# Patient Record
Sex: Female | Born: 1975
Health system: Southern US, Community
[De-identification: ages and names within clinical notes are randomized; demographics above are authoritative.]

## PROBLEM LIST (undated history)

## (undated) DIAGNOSIS — S0990XA Unspecified injury of head, initial encounter: Secondary | ICD-10-CM

## (undated) DIAGNOSIS — G43909 Migraine, unspecified, not intractable, without status migrainosus: Secondary | ICD-10-CM

## (undated) DIAGNOSIS — F419 Anxiety disorder, unspecified: Secondary | ICD-10-CM

## (undated) DIAGNOSIS — N3281 Overactive bladder: Secondary | ICD-10-CM

## (undated) HISTORY — PX: TUBAL LIGATION: SHX77

## (undated) HISTORY — PX: INSERTION OF MESH: SHX5868

## (undated) HISTORY — DX: Migraine, unspecified, not intractable, without status migrainosus: G43.909

## (undated) HISTORY — DX: Anxiety disorder, unspecified: F41.9

---

## 2013-10-10 ENCOUNTER — Encounter (HOSPITAL_BASED_OUTPATIENT_CLINIC_OR_DEPARTMENT_OTHER): Payer: Self-pay | Admitting: Emergency Medicine

## 2013-10-10 ENCOUNTER — Emergency Department (HOSPITAL_BASED_OUTPATIENT_CLINIC_OR_DEPARTMENT_OTHER)
Admission: EM | Admit: 2013-10-10 | Discharge: 2013-10-10 | Disposition: A | Discharge status: Home / Self Care | Payer: Medicaid Other | Attending: Emergency Medicine | Admitting: Emergency Medicine

## 2013-10-10 DIAGNOSIS — J329 Chronic sinusitis, unspecified: Secondary | ICD-10-CM | POA: Insufficient documentation

## 2013-10-10 DIAGNOSIS — IMO0001 Reserved for inherently not codable concepts without codable children: Secondary | ICD-10-CM | POA: Insufficient documentation

## 2013-10-10 LAB — RAPID STREP SCREEN (MED CTR MEBANE ONLY): Streptococcus, Group A Screen (Direct): NEGATIVE

## 2013-10-10 MED ORDER — AMOXICILLIN 500 MG PO CAPS
1000.0000 mg | ORAL_CAPSULE | Freq: Once | ORAL | Status: AC
  Administered 2013-10-10: 1000 mg via ORAL
  Filled 2013-10-10: qty 2

## 2013-10-10 MED ORDER — ACETAMINOPHEN 325 MG PO TABS
650.0000 mg | ORAL_TABLET | Freq: Once | ORAL | Status: AC
  Administered 2013-10-10: 650 mg via ORAL
  Filled 2013-10-10: qty 2

## 2013-10-10 MED ORDER — AMOXICILLIN 500 MG PO CAPS: 1000.0000 mg | ORAL_CAPSULE | Freq: Two times a day (BID) | ORAL | Status: DC

## 2013-10-10 NOTE — ED Notes (Signed)
Sore throat and chills since yesterday.  

## 2013-10-10 NOTE — ED Provider Notes (Signed)
CSN: [id number]     Arrival date & time 10/10/13  1709 History   First MD Initiated Contact with Patient 10/10/13 1931     Chief Complaint  Patient presents with  . Sore Throat     (Consider location/radiation/quality/duration/timing/severity/associated sxs/prior Treatment) Patient is a 38 y.o. female presenting with pharyngitis. The history is provided by the patient. No language interpreter was used.  Sore Throat This is a new problem. The current episode started yesterday. The problem occurs constantly. Associated symptoms include congestion, myalgias and a sore throat. Pertinent negatives include no abdominal pain, chills, coughing, fever, nausea, rash or vomiting. Associated symptoms comments: She reports sinus pain and congestion with sinus pressure for the past 3 weeks. Yesterday she started having a sore throat and chills. No N, V. No cough. .    History reviewed. No pertinent past medical history. History reviewed. No pertinent past surgical history. No family history on file. History  Substance Use Topics  . Smoking status: Never Smoker   . Smokeless tobacco: Not on file  . Alcohol Use: Yes   OB History   Grav Para Term Preterm Abortions TAB SAB Ect Mult Living                 Review of Systems  Constitutional: Negative for fever and chills.  HENT: Positive for congestion, sinus pressure and sore throat. Negative for facial swelling, trouble swallowing and voice change.   Respiratory: Negative.  Negative for cough and shortness of breath.   Cardiovascular: Negative.   Gastrointestinal: Negative.  Negative for nausea, vomiting and abdominal pain.  Musculoskeletal: Positive for myalgias.  Skin: Negative.  Negative for rash.  Neurological: Negative.       Allergies  Review of patient's allergies indicates no known allergies.  Home Medications  No current outpatient prescriptions on file. BP 138/50  Pulse 109  Temp(Src) 100.4 F (38 C) (Oral)  Resp 22  Ht 5'  5" (1.651 m)  Wt 194 lb (87.998 kg)  BMI 32.28 kg/m2  SpO2 100%  LMP 10/07/2013 Physical Exam  Constitutional: She is oriented to person, place, and time. She appears well-developed and well-nourished.  HENT:  Head: Normocephalic.  Right Ear: External ear normal.  Left Ear: External ear normal.  Nose: Mucosal edema present. Right sinus exhibits frontal sinus tenderness. Left sinus exhibits frontal sinus tenderness.  Mouth/Throat: Oropharynx is clear and moist.  Neck: Normal range of motion. Neck supple.  Cardiovascular: Normal rate and normal heart sounds.   No murmur heard. Pulmonary/Chest: Effort normal and breath sounds normal. She has no wheezes. She has no rales.  Abdominal: Soft. Bowel sounds are normal. She exhibits no distension. There is no tenderness.  Musculoskeletal: Normal range of motion.  Lymphadenopathy:    She has no cervical adenopathy.  Neurological: She is oriented to person, place, and time.  Skin: Skin is warm and dry. No pallor.    ED Course  Procedures (including critical care time) Labs Review Labs Reviewed  RAPID STREP SCREEN  CULTURE, GROUP A STREP   Imaging Review No results found.   EKG Interpretation None      MDM   Final diagnoses:  None    1. Sinusitis  Uncomplicated sinus infection.     Arnoldo HookerShari A Manfred Laspina, PA-C 10/10/13 2029

## 2013-10-10 NOTE — Discharge Instructions (Signed)
RECOMMEND SALINE NASAL SPRAYS FOR FURTHER RELIEF OF SYMPTOMS. CONTINUE TYLENOL AND/OR IBUPROFEN FOR ACHES, FEVER.    Sinusitis Sinusitis is redness, soreness, and swelling (inflammation) of the paranasal sinuses. Paranasal sinuses are air pockets within the bones of your face (beneath the eyes, the middle of the forehead, or above the eyes). In healthy paranasal sinuses, mucus is able to drain out, and air is able to circulate through them by way of your nose. However, when your paranasal sinuses are inflamed, mucus and air can become trapped. This can allow bacteria and other germs to grow and cause infection. Sinusitis can develop quickly and last only a short time (acute) or continue over a long period (chronic). Sinusitis that lasts for more than 12 weeks is considered chronic.  CAUSES  Causes of sinusitis include:  Allergies.  Structural abnormalities, such as displacement of the cartilage that separates your nostrils (deviated septum), which can decrease the air flow through your nose and sinuses and affect sinus drainage.  Functional abnormalities, such as when the small hairs (cilia) that line your sinuses and help remove mucus do not work properly or are not present. SYMPTOMS  Symptoms of acute and chronic sinusitis are the same. The primary symptoms are pain and pressure around the affected sinuses. Other symptoms include:  Upper toothache.  Earache.  Headache.  Bad breath.  Decreased sense of smell and taste.  A cough, which worsens when you are lying flat.  Fatigue.  Fever.  Thick drainage from your nose, which often is green and may contain pus (purulent).  Swelling and warmth over the affected sinuses. DIAGNOSIS  Your caregiver will perform a physical exam. During the exam, your caregiver may:  Look in your nose for signs of abnormal growths in your nostrils (nasal polyps).  Tap over the affected sinus to check for signs of infection.  View the inside of your  sinuses (endoscopy) with a special imaging device with a light attached (endoscope), which is inserted into your sinuses. If your caregiver suspects that you have chronic sinusitis, one or more of the following tests may be recommended:  Allergy tests.  Nasal culture A sample of mucus is taken from your nose and sent to a lab and screened for bacteria.  Nasal cytology A sample of mucus is taken from your nose and examined by your caregiver to determine if your sinusitis is related to an allergy. TREATMENT  Most cases of acute sinusitis are related to a viral infection and will resolve on their own within 10 days. Sometimes medicines are prescribed to help relieve symptoms (pain medicine, decongestants, nasal steroid sprays, or saline sprays).  However, for sinusitis related to a bacterial infection, your caregiver will prescribe antibiotic medicines. These are medicines that will help kill the bacteria causing the infection.  Rarely, sinusitis is caused by a fungal infection. In theses cases, your caregiver will prescribe antifungal medicine. For some cases of chronic sinusitis, surgery is needed. Generally, these are cases in which sinusitis recurs more than 3 times per year, despite other treatments. HOME CARE INSTRUCTIONS   Drink plenty of water. Water helps thin the mucus so your sinuses can drain more easily.  Use a humidifier.  Inhale steam 3 to 4 times a day (for example, sit in the bathroom with the shower running).  Apply a warm, moist washcloth to your face 3 to 4 times a day, or as directed by your caregiver.  Use saline nasal sprays to help moisten and clean your sinuses.  Take over-the-counter or prescription medicines for pain, discomfort, or fever only as directed by your caregiver. SEEK IMMEDIATE MEDICAL CARE IF:  You have increasing pain or severe headaches.  You have nausea, vomiting, or drowsiness.  You have swelling around your face.  You have vision  problems.  You have a stiff neck.  You have difficulty breathing. MAKE SURE YOU:   Understand these instructions.  Will watch your condition.  Will get help right away if you are not doing well or get worse. Document Released: 07/14/2005 Document Revised: 10/06/2011 Document Reviewed: 07/29/2011 [facility] Patient Information 2014 [place], [place].  Sinus Headache A sinus headache happens when your sinuses become clogged or puffy (swollen). Sinus headaches can be mild or severe. HOME CARE  Take your medicines (antibiotics) as told. Finish them even if you start to feel better.  Only take medicine as told by your doctor.  Use a nose spray if you feel stuffed up (congested). GET HELP RIGHT AWAY IF:  You have a fever.  You have trouble seeing.  You suddenly have pain in your face or head.  You start to twitch or shake (seizure).  You are confused.  You get headaches more than once a week.  Light or sound bothers you.  You feel sick to your stomach (nauseous) or throw up (vomit).  Your headaches do not get better with treatment. MAKE SURE YOU:  Understand these instructions.  Will watch your condition.  Will get help right away if you are not doing well or get worse. Document Released: 11/13/2010 Document Revised: 10/06/2011 Document Reviewed: 11/13/2010 [facility] Patient Information 2014 [place], [place].

## 2013-10-12 NOTE — ED Provider Notes (Signed)
Medical screening examination/treatment/procedure(s) were performed by non-physician practitioner and as supervising physician I was immediately available for consultation/collaboration.  Megan E Docherty, MD 10/12/13 1204 

## 2013-10-13 LAB — CULTURE, GROUP A STREP

## 2017-02-20 ENCOUNTER — Encounter (HOSPITAL_BASED_OUTPATIENT_CLINIC_OR_DEPARTMENT_OTHER): Payer: Self-pay

## 2017-02-20 ENCOUNTER — Emergency Department (HOSPITAL_BASED_OUTPATIENT_CLINIC_OR_DEPARTMENT_OTHER)
Admission: EM | Admit: 2017-02-20 | Discharge: 2017-02-20 | Disposition: A | Discharge status: Home / Self Care | Payer: Medicaid Other | Attending: Emergency Medicine | Admitting: Emergency Medicine

## 2017-02-20 DIAGNOSIS — R300 Dysuria: Secondary | ICD-10-CM | POA: Diagnosis present

## 2017-02-20 DIAGNOSIS — N390 Urinary tract infection, site not specified: Secondary | ICD-10-CM | POA: Diagnosis not present

## 2017-02-20 HISTORY — DX: Overactive bladder: N32.81

## 2017-02-20 LAB — URINALYSIS, MICROSCOPIC (REFLEX)

## 2017-02-20 LAB — URINALYSIS, ROUTINE W REFLEX MICROSCOPIC
Bilirubin Urine: NEGATIVE
Glucose, UA: NEGATIVE mg/dL
Ketones, ur: NEGATIVE mg/dL
Nitrite: NEGATIVE
PROTEIN: NEGATIVE mg/dL
Specific Gravity, Urine: 1.024 (ref 1.005–1.030)
pH: 7 (ref 5.0–8.0)

## 2017-02-20 LAB — PREGNANCY, URINE: PREG TEST UR: NEGATIVE

## 2017-02-20 MED ORDER — FLUCONAZOLE 150 MG PO TABS: 150.0000 mg | ORAL_TABLET | Freq: Once | ORAL | 0 refills | Status: AC

## 2017-02-20 MED ORDER — CEPHALEXIN 500 MG PO CAPS: 500.0000 mg | ORAL_CAPSULE | Freq: Four times a day (QID) | ORAL | 0 refills | Status: DC

## 2017-02-20 MED ORDER — PHENAZOPYRIDINE HCL 100 MG PO TABS
200.0000 mg | ORAL_TABLET | Freq: Once | ORAL | Status: AC
  Administered 2017-02-20: 200 mg via ORAL
  Filled 2017-02-20: qty 2

## 2017-02-20 MED ORDER — CEPHALEXIN 250 MG PO CAPS
500.0000 mg | ORAL_CAPSULE | Freq: Once | ORAL | Status: AC
  Administered 2017-02-20: 500 mg via ORAL
  Filled 2017-02-20: qty 2

## 2017-02-20 NOTE — ED Triage Notes (Signed)
Pt c/o urinary frequency for the last two days with urgency and feeling like she cannot empty her bladder, has midline back pain, vaginal discharge and subjective fever, has not taken anything except tylenol two days ago

## 2017-02-20 NOTE — Discharge Instructions (Signed)
Take the medications as prescribed for the urinary tract infection. Follow-up with your doctor if not better next week. Return for fever or worsening symptoms

## 2017-02-20 NOTE — ED Provider Notes (Signed)
[facility]   CSN: [id number] Arrival date & time: 02/20/17  2129     History   Chief Complaint Chief Complaint  Patient presents with  . Dysuria    HPI Jill [name] is a 41 y.o. female.  HPI Patient presents to the emergency room with complaints of urinary frequency and discomfort.  Patient states she has a history of urinary tract infections and thinks she may have another one. She does not feel like she can empty her bladder properly. She's had some lower back pain but no trouble with any fevers. No vomiting or diarrhea. Patient denies any vaginal odor. She denies any discharge but feels more of a discomfort in that area. Patient states she has had problems with bacterial vaginosis and yeast infections when she takes antibiotics. Past Medical History:  Diagnosis Date  . Overactive bladder     There are no active problems to display for this patient.   Past Surgical History:  Procedure Laterality Date  . TUBAL LIGATION      OB History    No data available       Home Medications    Prior to Admission medications   Medication Sig Start Date End Date Taking? Authorizing Provider  amoxicillin (AMOXIL) 500 MG capsule Take 2 capsules (1,000 mg total) by mouth 2 (two) times daily. 10/10/13   Elpidio AnisUpstill, Shari, PA-C  cephALEXin (KEFLEX) 500 MG capsule Take 1 capsule (500 mg total) by mouth 4 (four) times daily. 02/20/17   Linwood DibblesKnapp, Estephany Perot, MD  fluconazole (DIFLUCAN) 150 MG tablet Take 1 tablet (150 mg total) by mouth once. 02/20/17 02/20/17  Linwood DibblesKnapp, Cleveland Paiz, MD    Family History No family history on file.  Social History Social History  Substance Use Topics  . Smoking status: Never Smoker  . Smokeless tobacco: Not on file  . Alcohol use Yes     Allergies   Patient has no known allergies.   Review of Systems Review of Systems  All other systems reviewed and are negative.    Physical Exam Updated Vital Signs BP 116/66 (BP Location: Left Arm)    Pulse 85   Temp 98.2 F (36.8 C) (Oral)   Resp 18   Ht 1.651 m (5\' 5" )   Wt 84.4 kg (186 lb)   LMP 02/18/2017   SpO2 100%   BMI 30.95 kg/m   Physical Exam  Constitutional: She appears well-developed and well-nourished. No distress.  HENT:  Head: Normocephalic and atraumatic.  Right Ear: External ear normal.  Left Ear: External ear normal.  Eyes: Conjunctivae are normal. Right eye exhibits no discharge. Left eye exhibits no discharge. No scleral icterus.  Neck: Neck supple. No tracheal deviation present.  Cardiovascular: Normal rate.   Pulmonary/Chest: Effort normal. No stridor. No respiratory distress.  Abdominal: Soft. Bowel sounds are normal. She exhibits no distension. There is no tenderness.  Musculoskeletal: She exhibits no edema.  Neurological: She is alert. Cranial nerve deficit: no gross deficits.  Skin: Skin is warm and dry. No rash noted.  Psychiatric: She has a normal mood and affect.  Nursing note and vitals reviewed.    ED Treatments / Results  Labs (all labs ordered are listed, but only abnormal results are displayed) Labs Reviewed  URINALYSIS, ROUTINE W REFLEX MICROSCOPIC - Abnormal; Notable for the following:       Result Value   APPearance CLOUDY (*)    Hgb urine dipstick MODERATE (*)    Leukocytes, UA MODERATE (*)  All other components within normal limits  URINALYSIS, MICROSCOPIC (REFLEX) - Abnormal; Notable for the following:    Bacteria, UA MANY (*)    Squamous Epithelial / LPF 0-5 (*)    All other components within normal limits  URINE CULTURE  PREGNANCY, URINE    Radiology No results found.  Procedures Procedures (including critical care time)  Medications Ordered in ED Medications  cephALEXin (KEFLEX) capsule 500 mg (not administered)  phenazopyridine (PYRIDIUM) tablet 200 mg (not administered)     Initial Impression / Assessment and Plan / ED Course  I have reviewed the triage vital signs and the nursing notes.  Pertinent labs  & imaging results that were available during my care of the patient were reviewed by me and considered in my medical decision making (see chart for details).   patient's urinalysis is consistent with a urinary tract infection. I will prescribe her Keflex for her infection. Patient states she gets yeast infections over give her Diflucan tablet to take. I don't think that a pelvic exam is necessary at this point however we did discuss following up with her primary doctor if her symptoms do not improve after the antibiotics.  Final Clinical Impressions(s) / ED Diagnoses   Final diagnoses:  Lower urinary tract infectious disease    New Prescriptions New Prescriptions   CEPHALEXIN (KEFLEX) 500 MG CAPSULE    Take 1 capsule (500 mg total) by mouth 4 (four) times daily.   FLUCONAZOLE (DIFLUCAN) 150 MG TABLET    Take 1 tablet (150 mg total) by mouth once.     Linwood DibblesKnapp, Hesston Hitchens, MD 02/20/17 [phone]

## 2017-02-23 LAB — URINE CULTURE: Culture: >=100000 — AB

## 2017-02-24 ENCOUNTER — Telehealth (HOSPITAL_BASED_OUTPATIENT_CLINIC_OR_DEPARTMENT_OTHER): Payer: Self-pay | Admitting: Emergency Medicine

## 2017-02-24 NOTE — Telephone Encounter (Signed)
Post ED Visit - Positive Culture Follow-up  Culture report reviewed by antimicrobial stewardship pharmacist:  []  Enzo BiNathan Batchelder, Pharm.D. []  Celedonio MiyamotoJeremy Frens, Pharm.D., BCPS AQ-ID []  Garvin FilaMike Maccia, Pharm.D., BCPS []  Georgina PillionElizabeth Martin, Pharm.D., BCPS []  SemmesMinh Pham, VermontPharm.D., BCPS, AAHIVP []  Estella HuskMichelle Turner, Pharm.D., BCPS, AAHIVP []  Lysle Pearlachel Rumbarger, PharmD, BCPS []  Casilda Carlsaylor Stone, PharmD, BCPS []  Pollyann SamplesAndy Johnston, PharmD, BCPS Sharin MonsEmily Sinclair PharmD  Positive urine culture Treated with cephalexin and Fluconazole,organism sensitive to the same and no further patient follow-up is required at this time.  Berle MullMiller, Madyx Delfin 02/24/2017, 1:51 PM

## 2017-05-18 ENCOUNTER — Encounter (HOSPITAL_BASED_OUTPATIENT_CLINIC_OR_DEPARTMENT_OTHER): Payer: Self-pay | Admitting: *Deleted

## 2017-05-18 ENCOUNTER — Emergency Department (HOSPITAL_BASED_OUTPATIENT_CLINIC_OR_DEPARTMENT_OTHER): Payer: Medicaid Other

## 2017-05-18 ENCOUNTER — Emergency Department (HOSPITAL_BASED_OUTPATIENT_CLINIC_OR_DEPARTMENT_OTHER)
Admission: EM | Admit: 2017-05-18 | Discharge: 2017-05-18 | Disposition: A | Discharge status: Home / Self Care | Payer: Medicaid Other | Attending: Emergency Medicine | Admitting: Emergency Medicine

## 2017-05-18 DIAGNOSIS — R0602 Shortness of breath: Secondary | ICD-10-CM | POA: Diagnosis present

## 2017-05-18 DIAGNOSIS — Z79899 Other long term (current) drug therapy: Secondary | ICD-10-CM | POA: Insufficient documentation

## 2017-05-18 DIAGNOSIS — M940 Chondrocostal junction syndrome [Tietze]: Secondary | ICD-10-CM | POA: Diagnosis not present

## 2017-05-18 HISTORY — DX: Unspecified injury of head, initial encounter: S09.90XA

## 2017-05-18 LAB — CBC WITH DIFFERENTIAL/PLATELET
BASOS ABS: 0 10*3/uL (ref 0.0–0.1)
Basophils Relative: 0 %
Eosinophils Absolute: 0.1 10*3/uL (ref 0.0–0.7)
Eosinophils Relative: 2 %
HCT: 38.2 % (ref 36.0–46.0)
Hemoglobin: 13.2 g/dL (ref 12.0–15.0)
LYMPHS PCT: 45 %
Lymphs Abs: 3.2 10*3/uL (ref 0.7–4.0)
MCH: 30.4 pg (ref 26.0–34.0)
MCHC: 34.6 g/dL (ref 30.0–36.0)
MCV: 88 fL (ref 78.0–100.0)
Monocytes Absolute: 0.5 10*3/uL (ref 0.1–1.0)
Monocytes Relative: 7 %
NEUTROS ABS: 3.3 10*3/uL (ref 1.7–7.7)
Neutrophils Relative %: 46 %
Platelets: 250 10*3/uL (ref 150–400)
RBC: 4.34 MIL/uL (ref 3.87–5.11)
RDW: 12.1 % (ref 11.5–15.5)
WBC: 7 10*3/uL (ref 4.0–10.5)

## 2017-05-18 LAB — BASIC METABOLIC PANEL
Anion gap: 5 (ref 5–15)
BUN: 8 mg/dL (ref 6–20)
CALCIUM: 8.7 mg/dL — AB (ref 8.9–10.3)
CO2: 27 mmol/L (ref 22–32)
Chloride: 104 mmol/L (ref 101–111)
Creatinine, Ser: 0.7 mg/dL (ref 0.44–1.00)
GFR calc Af Amer: >60 mL/min (ref >60)
GFR calc non Af Amer: >60 mL/min (ref >60)
Glucose, Bld: 90 mg/dL (ref 65–99)
POTASSIUM: 3.2 mmol/L — AB (ref 3.5–5.1)
Sodium: 136 mmol/L (ref 135–145)

## 2017-05-18 LAB — TROPONIN I: Troponin I: <0.03 ng/mL (ref <0.03)

## 2017-05-18 MED ORDER — NAPROXEN 500 MG PO TABS: 500.0000 mg | ORAL_TABLET | Freq: Two times a day (BID) | ORAL | 0 refills | Status: DC

## 2017-05-18 NOTE — Discharge Instructions (Signed)
Take medications as needed for pain.  Follow-up with a primary care doctor if not improving within the week

## 2017-05-18 NOTE — ED Triage Notes (Signed)
Chest pain burning sensation x 4 days. Cough. SOB. Left arm is burning. She is talking in complete sentences. No distress. States she had a sinus infection right before the pain.

## 2017-05-18 NOTE — ED Provider Notes (Signed)
[facility] EMERGENCY DEPARTMENT Provider Note   CSN: [id number] Arrival date & time: 05/18/17  1945     History   Chief Complaint Chief Complaint  Patient presents with  . Shortness of Breath  . Chest Pain    HPI Jill [name] is a 41 y.o. female.  HPI Pt complains of sharp and burning left sided chest pain that started a few days ago.  The pain has been constant although waxing and waning in severity but never resolved.  She has been coughing but not severely.  No fevers.  No shortness of breath.  She has some burning in her left arm as well.  No history of heart disease.  No history fo PE.  Pt does not smoke. Past Medical History:  Diagnosis Date  . Head injury   . Overactive bladder     There are no active problems to display for this patient.   Past Surgical History:  Procedure Laterality Date  . TUBAL LIGATION      OB History    No data available       Home Medications    Prior to Admission medications   Medication Sig Start Date End Date Taking? Authorizing Provider  GABAPENTIN PO Take by mouth.   Yes [provider]  OXYBUTYNIN CHLORIDE PO Take by mouth.   Yes [provider]  amoxicillin (AMOXIL) 500 MG capsule Take 2 capsules (1,000 mg total) by mouth 2 (two) times daily. 10/10/13   Elpidio Anis, PA-C  cephALEXin (KEFLEX) 500 MG capsule Take 1 capsule (500 mg total) by mouth 4 (four) times daily. 02/20/17   Linwood Dibbles, MD  naproxen (NAPROSYN) 500 MG tablet Take 1 tablet (500 mg total) by mouth 2 (two) times daily with a meal. As needed for pain 05/18/17   Linwood Dibbles, MD    Family History No family history on file.  Social History Social History  Substance Use Topics  . Smoking status: Never Smoker  . Smokeless tobacco: Not on file  . Alcohol use Yes     Allergies   Patient has no known allergies.   Review of Systems Review of Systems  All other systems reviewed and are negative.    Physical Exam Updated  Vital Signs BP 119/89   Pulse 80   Temp 98.2 F (36.8 C) (Oral)   Resp 15   Ht 1.651 m (5\' 5" )   Wt 83.9 kg (185 lb)   LMP 05/04/2017   SpO2 100%   BMI 30.79 kg/m   Physical Exam  Constitutional: She appears well-developed and well-nourished. No distress.  HENT:  Head: Normocephalic and atraumatic.  Right Ear: External ear normal.  Left Ear: External ear normal.  Eyes: Conjunctivae are normal. Right eye exhibits no discharge. Left eye exhibits no discharge. No scleral icterus.  Neck: Neck supple. No tracheal deviation present.  Cardiovascular: Normal rate, regular rhythm and intact distal pulses.   Pulmonary/Chest: Effort normal and breath sounds normal. No stridor. No respiratory distress. She has no wheezes. She has no rales. She exhibits tenderness.  Abdominal: Soft. Bowel sounds are normal. She exhibits no distension. There is no tenderness. There is no rebound and no guarding.  Musculoskeletal: She exhibits no edema or tenderness.  Neurological: She is alert. She has normal strength. No cranial nerve deficit (no facial droop, extraocular movements intact, no slurred speech) or sensory deficit. She exhibits normal muscle tone. She displays no seizure activity. Coordination normal.  Skin: Skin is warm and dry.  No rash noted.  Psychiatric: She has a normal mood and affect.  Nursing note and vitals reviewed.    ED Treatments / Results  Labs (all labs ordered are listed, but only abnormal results are displayed) Labs Reviewed  BASIC METABOLIC PANEL - Abnormal; Notable for the following:       Result Value   Potassium 3.2 (*)    Calcium 8.7 (*)    All other components within normal limits  CBC WITH DIFFERENTIAL/PLATELET  TROPONIN I    EKG  EKG Interpretation  Date/Time:  Monday May 18 2017 20:00:50 EDT Ventricular Rate:  72 PR Interval:  136 QRS Duration: 94 QT Interval:  418 QTC Calculation: 457 R Axis:   62 Text Interpretation:  Normal sinus rhythm Right  atrial enlargement Incomplete right bundle branch block Borderline ECG No old tracing to compare Confirmed by Linwood DibblesKnapp, Gamal Todisco [phone]([id number]) on 05/18/2017 8:01:27 PM       Radiology Dg Chest 2 View  Result Date: 05/18/2017 CLINICAL DATA:  Acute onset of left-sided chest and back pain. Shortness of breath, cough and left arm pain. Initial encounter. EXAM: CHEST  2 VIEW COMPARISON:  Chest radiograph and CTA of the chest performed 01/30/2015 FINDINGS: The lungs are well-aerated and clear. There is no evidence of focal opacification, pleural effusion or pneumothorax. The heart is normal in size; the mediastinal contour is within normal limits. No acute osseous abnormalities are seen. IMPRESSION: No acute cardiopulmonary process seen. Electronically Signed   By: Roanna RaiderJeffery  Chang M.D.   On: 05/18/2017 22:40    Procedures Procedures (including critical care time)  Medications Ordered in ED Medications - No data to display   Initial Impression / Assessment and Plan / ED Course  I have reviewed the triage vital signs and the nursing notes.  Pertinent labs & imaging results that were available during my care of the patient were reviewed by me and considered in my medical decision making (see chart for details).   Sx atypical for ACS.  EKG and troponin are normal.  Low risk for cardiac etiology.  Low risk for PE.  Perc negative.    Suspect chest wall pain.  Will dc home with nsaids Final Clinical Impressions(s) / ED Diagnoses   Final diagnoses:  Costochondritis    New Prescriptions New Prescriptions   NAPROXEN (NAPROSYN) 500 MG TABLET    Take 1 tablet (500 mg total) by mouth 2 (two) times daily with a meal. As needed for pain     Linwood DibblesKnapp, Trevyn Lumpkin, MD 05/18/17 2338

## 2019-02-28 ENCOUNTER — Other Ambulatory Visit: Payer: Self-pay

## 2019-02-28 ENCOUNTER — Emergency Department (HOSPITAL_BASED_OUTPATIENT_CLINIC_OR_DEPARTMENT_OTHER)
Admission: EM | Admit: 2019-02-28 | Discharge: 2019-02-28 | Disposition: A | Discharge status: Home / Self Care | Payer: Medicaid Other | Attending: Emergency Medicine | Admitting: Emergency Medicine

## 2019-02-28 ENCOUNTER — Encounter (HOSPITAL_BASED_OUTPATIENT_CLINIC_OR_DEPARTMENT_OTHER): Payer: Self-pay

## 2019-02-28 DIAGNOSIS — H538 Other visual disturbances: Secondary | ICD-10-CM | POA: Diagnosis present

## 2019-02-28 DIAGNOSIS — Z5321 Procedure and treatment not carried out due to patient leaving prior to being seen by health care provider: Secondary | ICD-10-CM | POA: Diagnosis not present

## 2019-02-28 NOTE — ED Triage Notes (Signed)
Pt state she woke this am with redness to right sclera-later in the day she started feeling a "pressure and like it's hot"-blurred vision started~6pm-denies injury to eye-NAD-steady gait

## 2019-03-01 ENCOUNTER — Emergency Department (HOSPITAL_BASED_OUTPATIENT_CLINIC_OR_DEPARTMENT_OTHER)
Admission: EM | Admit: 2019-03-01 | Discharge: 2019-03-01 | Disposition: A | Discharge status: Home / Self Care | Payer: Medicaid Other | Attending: Emergency Medicine | Admitting: Emergency Medicine

## 2019-03-01 ENCOUNTER — Other Ambulatory Visit: Payer: Self-pay

## 2019-03-01 ENCOUNTER — Encounter (HOSPITAL_BASED_OUTPATIENT_CLINIC_OR_DEPARTMENT_OTHER): Payer: Self-pay | Admitting: Emergency Medicine

## 2019-03-01 DIAGNOSIS — H1131 Conjunctival hemorrhage, right eye: Secondary | ICD-10-CM | POA: Diagnosis not present

## 2019-03-01 DIAGNOSIS — H5711 Ocular pain, right eye: Secondary | ICD-10-CM | POA: Diagnosis present

## 2019-03-01 MED ORDER — FLUORESCEIN SODIUM 1 MG OP STRP
1.0000 | ORAL_STRIP | Freq: Once | OPHTHALMIC | Status: AC
  Administered 2019-03-01: 13:00:00 1 via OPHTHALMIC
  Filled 2019-03-01: qty 1

## 2019-03-01 MED ORDER — TETRACAINE HCL 0.5 % OP SOLN
2.0000 [drp] | Freq: Once | OPHTHALMIC | Status: AC
  Administered 2019-03-01: 13:00:00 2 [drp] via OPHTHALMIC
  Filled 2019-03-01: qty 4

## 2019-03-01 NOTE — ED Notes (Signed)
ED Provider at bedside. 

## 2019-03-01 NOTE — ED Provider Notes (Signed)
[facility] EMERGENCY DEPARTMENT Provider Note   CSN: [id number] Arrival date & time: 03/01/19  1031    History   Chief Complaint Chief Complaint  Patient presents with  . Eye Problem    HPI Jill [name] is a 43 y.o. female.     43 yo F with a chief complaints of right eye pain.  Patient noted that her right eye was red yesterday.  No significant symptoms then but now feels that her eye has a dull ache.  She has some chronic photosensitivity that has not changed.  Visual acuity is not changed.  She feels that she has a pressure that goes back further in the right side of her face.  No cough congestion or fever.  No contact lens use.  No trauma to the eye.  No metal or woodwork.  The history is provided by the patient.  Eye Problem Location:  Right eye Quality:  Aching Severity:  Moderate Onset quality:  Gradual Duration:  2 days Timing:  Constant Progression:  Worsening Chronicity:  New Context: not direct trauma, not foreign body and not using machinery   Relieved by:  Nothing Worsened by:  Nothing Ineffective treatments:  None tried Associated symptoms: photophobia (chronic and unchanged) and redness   Associated symptoms: no headaches, no nausea and no vomiting     Past Medical History:  Diagnosis Date  . Head injury   . Overactive bladder     There are no active problems to display for this patient.   Past Surgical History:  Procedure Laterality Date  . TUBAL LIGATION       OB History   No obstetric history on file.      Home Medications    Prior to Admission medications   Medication Sig Start Date End Date Taking? Authorizing Provider  GABAPENTIN PO Take by mouth.   Yes [provider]  naproxen (NAPROSYN) 500 MG tablet Take 1 tablet (500 mg total) by mouth 2 (two) times daily with a meal. As needed for pain 05/18/17  Yes Dorie Rank, MD  OXYBUTYNIN CHLORIDE PO Take by mouth.   Yes [provider]  amoxicillin (AMOXIL)  500 MG capsule Take 2 capsules (1,000 mg total) by mouth 2 (two) times daily. 10/10/13   Charlann Lange, PA-C  cephALEXin (KEFLEX) 500 MG capsule Take 1 capsule (500 mg total) by mouth 4 (four) times daily. 02/20/17   Dorie Rank, MD    Family History No family history on file.  Social History Social History   Tobacco Use  . Smoking status: Never Smoker  . Smokeless tobacco: Never Used  Substance Use Topics  . Alcohol use: Yes    Comment: occ  . Drug use: No     Allergies   Patient has no known allergies.   Review of Systems Review of Systems  Constitutional: Negative for chills and fever.  HENT: Negative for congestion and rhinorrhea.   Eyes: Positive for photophobia (chronic and unchanged), pain and redness. Negative for visual disturbance.  Respiratory: Negative for shortness of breath and wheezing.   Cardiovascular: Negative for chest pain and palpitations.  Gastrointestinal: Negative for nausea and vomiting.  Genitourinary: Negative for dysuria and urgency.  Musculoskeletal: Negative for arthralgias and myalgias.  Skin: Negative for pallor and wound.  Neurological: Negative for dizziness and headaches.     Physical Exam Updated Vital Signs BP 129/77 (BP Location: Right Arm)   Pulse 64   Temp 99 F (37.2 C) (Oral)  Resp 16   Ht 5\' 5"  (1.651 m)   Wt 86.3 kg   LMP 02/21/2019   SpO2 100%   BMI 31.67 kg/m   Physical Exam Vitals signs and nursing note reviewed.  Constitutional:      General: She is not in acute distress.    Appearance: She is well-developed. She is not diaphoretic.  HENT:     Head: Normocephalic and atraumatic.     Comments: Fairly large subconjunctival hematoma to the medial aspect of the right eye.  Pupil is 3 mm and reactive.  No obvious foreign body.  No hyphema Eyes:     Pupils: Pupils are equal, round, and reactive to light.     Right eye: No corneal abrasion or fluorescein uptake. Seidel exam negative.  Neck:     Musculoskeletal:  Normal range of motion and neck supple.  Cardiovascular:     Rate and Rhythm: Normal rate and regular rhythm.     Heart sounds: No murmur. No friction rub. No gallop.   Pulmonary:     Effort: Pulmonary effort is normal.     Breath sounds: No wheezing or rales.  Abdominal:     General: There is no distension.     Palpations: Abdomen is soft.     Tenderness: There is no abdominal tenderness.  Musculoskeletal:        General: No tenderness.  Skin:    General: Skin is warm and dry.  Neurological:     Mental Status: She is alert and oriented to person, place, and time.  Psychiatric:        Behavior: Behavior normal.      ED Treatments / Results  Labs (all labs ordered are listed, but only abnormal results are displayed) Labs Reviewed - No data to display  EKG None  Radiology No results found.  Procedures Procedures (including critical care time)  Medications Ordered in ED Medications  fluorescein ophthalmic strip 1 strip (has no administration in time range)  tetracaine (PONTOCAINE) 0.5 % ophthalmic solution 2 drop (has no administration in time range)     Initial Impression / Assessment and Plan / ED Course  I have reviewed the triage vital signs and the nursing notes.  Pertinent labs & imaging results that were available during my care of the patient were reviewed by me and considered in my medical decision making (see chart for details).        43 yo F with subconjunctival hematoma to the right eye.  Noticed yesterday worsening subconjunctival hemorrhage.  She now has an ache to the right eye as well.  12:55 PM:  I have discussed the diagnosis/risks/treatment options with the patient and believe the pt to be eligible for discharge home to follow-up with PCP. We also discussed returning to the ED immediately if new or worsening sx occur. We discussed the sx which are most concerning (e.g., sudden worsening pain, fever, inability to tolerate by mouth) that  necessitate immediate return. Medications administered to the patient during their visit and any new prescriptions provided to the patient are listed below.  Medications given during this visit Medications  fluorescein ophthalmic strip 1 strip (has no administration in time range)  tetracaine (PONTOCAINE) 0.5 % ophthalmic solution 2 drop (has no administration in time range)     The patient appears reasonably screen and/or stabilized for discharge and I doubt any other medical condition or other [facility] requiring further screening, evaluation, or treatment in the ED at this time prior to  discharge.    Final Clinical Impressions(s) / ED Diagnoses   Final diagnoses:  Subconjunctival hemorrhage of right eye    ED Discharge Orders    None       Melene PlanFloyd, Floreine Kingdon, DO 03/01/19 1255

## 2019-03-01 NOTE — ED Triage Notes (Signed)
Inner canthus area of right eye reddened.  Appears to have subconjunctival hemorrhage.

## 2019-06-29 ENCOUNTER — Encounter (HOSPITAL_BASED_OUTPATIENT_CLINIC_OR_DEPARTMENT_OTHER): Payer: Self-pay | Admitting: Emergency Medicine

## 2019-06-29 ENCOUNTER — Emergency Department (HOSPITAL_BASED_OUTPATIENT_CLINIC_OR_DEPARTMENT_OTHER): Payer: Medicaid Other

## 2019-06-29 ENCOUNTER — Other Ambulatory Visit: Payer: Self-pay

## 2019-06-29 ENCOUNTER — Emergency Department (HOSPITAL_BASED_OUTPATIENT_CLINIC_OR_DEPARTMENT_OTHER)
Admission: EM | Admit: 2019-06-29 | Discharge: 2019-06-29 | Disposition: A | Discharge status: Home / Self Care | Payer: Medicaid Other | Attending: Emergency Medicine | Admitting: Emergency Medicine

## 2019-06-29 DIAGNOSIS — Z20822 Contact with and (suspected) exposure to covid-19: Secondary | ICD-10-CM

## 2019-06-29 DIAGNOSIS — R05 Cough: Secondary | ICD-10-CM

## 2019-06-29 DIAGNOSIS — K219 Gastro-esophageal reflux disease without esophagitis: Secondary | ICD-10-CM

## 2019-06-29 DIAGNOSIS — R0789 Other chest pain: Secondary | ICD-10-CM | POA: Diagnosis present

## 2019-06-29 DIAGNOSIS — U071 COVID-19: Secondary | ICD-10-CM | POA: Diagnosis not present

## 2019-06-29 DIAGNOSIS — R059 Cough, unspecified: Secondary | ICD-10-CM

## 2019-06-29 LAB — SARS CORONAVIRUS 2 AG (30 MIN TAT): SARS Coronavirus 2 Ag: NEGATIVE

## 2019-06-29 MED ORDER — BENZONATATE 100 MG PO CAPS: 100.0000 mg | ORAL_CAPSULE | Freq: Three times a day (TID) | ORAL | 0 refills | Status: DC

## 2019-06-29 MED ORDER — PANTOPRAZOLE SODIUM 20 MG PO TBEC: 20.0000 mg | DELAYED_RELEASE_TABLET | Freq: Every day | ORAL | 0 refills | Status: DC

## 2019-06-29 MED FILL — PANTOPRAZOLE SOD DR 20 MG T: 20 | 30 days supply | Qty: 30 | Fill #0

## 2019-06-29 MED FILL — BENZONATATE 100 MG CAPS: 100 | 7 days supply | Qty: 21 | Fill #0

## 2019-06-29 NOTE — ED Provider Notes (Signed)
[facility] EMERGENCY DEPARTMENT Provider Note   CSN: [id number] Arrival date & time: 06/29/19  1110     History   Chief Complaint Chief Complaint  Patient presents with   Chest Pain    HPI Jill [name] is a 43 y.o. female a past medical history of overactive bladder who presents to the ED due to gradual onset of worsening chest pain and shortness of breath that started today. Patient's boyfriend tested positive for COVID on Monday. Patient notes her chest pain is located in the center of her chest that radiates up to her throat and bilaterally under her breasts and feels like a burning sensation. Chest pain is worse with deep inspiration. Patient notes she started to feel nasal congestion a few days prior that she felt was just a normal sinus infection.  She has tried over-the-counter Advil and cold/flu medication with mild relief.  Patient notes her shortness of breath is with exertion and at rest.  Denies history of asthma.  Patient denies history of blood clots, recent surgeries, recent immobilizations, and hemoptysis. Patient has had a tubal ligation and is not currently on any hormonal treatments. Patient denies abdominal pain, nausea, vomiting, diarrhea, and lower extremity edema.  Past Medical History:  Diagnosis Date   Head injury    Overactive bladder     There are no active problems to display for this patient.   Past Surgical History:  Procedure Laterality Date   TUBAL LIGATION       OB History   No obstetric history on file.      Home Medications    Prior to Admission medications   Medication Sig Start Date End Date Taking? Authorizing Provider  amoxicillin (AMOXIL) 500 MG capsule Take 2 capsules (1,000 mg total) by mouth 2 (two) times daily. 10/10/13   Charlann Lange, PA-C  benzonatate (TESSALON) 100 MG capsule Take 1 capsule (100 mg total) by mouth every 8 (eight) hours. 06/29/19   Cheek, Comer Locket, PA-C  cephALEXin (KEFLEX) 500 MG capsule Take 1  capsule (500 mg total) by mouth 4 (four) times daily. 02/20/17   Dorie Rank, MD  GABAPENTIN PO Take by mouth.    [provider]  naproxen (NAPROSYN) 500 MG tablet Take 1 tablet (500 mg total) by mouth 2 (two) times daily with a meal. As needed for pain 05/18/17   Dorie Rank, MD  OXYBUTYNIN CHLORIDE PO Take by mouth.    [provider]  pantoprazole (PROTONIX) 20 MG tablet Take 1 tablet (20 mg total) by mouth daily. 06/29/19 07/29/19  Jonette Eva, PA-C    Family History History reviewed. No pertinent family history.  Social History Social History   Tobacco Use   Smoking status: Never Smoker   Smokeless tobacco: Never Used  Substance Use Topics   Alcohol use: Yes    Comment: occ   Drug use: No     Allergies   Patient has no known allergies.   Review of Systems Review of Systems  Constitutional: Positive for chills. Negative for fever.  HENT: Positive for congestion and rhinorrhea. Negative for sore throat and trouble swallowing.   Respiratory: Positive for cough (dry) and shortness of breath.   Cardiovascular: Positive for chest pain. Negative for leg swelling.  Gastrointestinal: Negative for abdominal pain, diarrhea, nausea and vomiting.  Neurological: Positive for headaches.  All other systems reviewed and are negative.    Physical Exam Updated Vital Signs BP 116/80 (BP Location: Right Arm)    Pulse  77    Temp 98.3 F (36.8 C) (Oral)    Resp 13    Ht 5\' 5"  (1.651 m)    Wt 87.8 kg    LMP 06/15/2019 (Approximate)    SpO2 100%    BMI 32.20 kg/m   Physical Exam Vitals signs and nursing note reviewed.  Constitutional:      General: She is not in acute distress. HENT:     Head: Normocephalic.     Mouth/Throat:     Comments: Posterior oropharynx clear and mucous membranes moist, there is mild erythema but no edema or tonsillar exudates, uvula midline, normal phonation, no trismus, tolerating secretions without difficulty. Eyes:     Pupils:  Pupils are equal, round, and reactive to light.  Neck:     Musculoskeletal: Neck supple.  Cardiovascular:     Rate and Rhythm: Normal rate and regular rhythm.     Pulses: Normal pulses.     Heart sounds: Normal heart sounds. No murmur. No friction rub. No gallop.   Pulmonary:     Effort: Pulmonary effort is normal.     Breath sounds: Normal breath sounds.     Comments: Respirations equal and unlabored, patient able to speak in full sentences, lungs clear to auscultation bilaterally Chest:     Comments: Reproducible anterior chest wall tenderness Abdominal:     General: Abdomen is flat. Bowel sounds are normal. There is no distension.     Palpations: Abdomen is soft.     Tenderness: There is no abdominal tenderness. There is no guarding or rebound.  Musculoskeletal:     Right lower leg: No edema.     Left lower leg: No edema.  Skin:    General: Skin is warm and dry.  Neurological:     General: No focal deficit present.      ED Treatments / Results  Labs (all labs ordered are listed, but only abnormal results are displayed) Labs Reviewed  SARS CORONAVIRUS 2 AG (30 MIN TAT)  NOVEL CORONAVIRUS, NAA (HOSP ORDER, SEND-OUT TO REF LAB; TAT 18-24 HRS)    EKG EKG Interpretation  Date/Time:  Wednesday June 29 2019 11:21:01 EST Ventricular Rate:  82 PR Interval:    QRS Duration: 98 QT Interval:  374 QTC Calculation: 437 R Axis:   10 Text Interpretation: Sinus arrhythmia Ventricular premature complex LAE, consider biatrial enlargement RSR' in V1 or V2, right VCD or RVH Confirmed by [id number] [phone]) on 06/29/2019 11:27:16 AM   Radiology Dg Chest Portable 1 View  Result Date: 06/29/2019 CLINICAL DATA:  Shortness of breath, COVID-19 exposure EXAM: PORTABLE CHEST 1 VIEW COMPARISON:  05/18/2017 FINDINGS: The heart size and mediastinal contours are within normal limits. Both lungs are clear. The visualized skeletal structures are unremarkable. IMPRESSION: No active disease.  Electronically Signed   By: 05/20/2017 M.D.   On: 06/29/2019 11:51    Procedures Procedures (including critical care time)  Medications Ordered in ED Medications - No data to display   Initial Impression / Assessment and Plan / ED Course  I have reviewed the triage vital signs and the nursing notes.  Pertinent labs & imaging results that were available during my care of the patient were reviewed by me and considered in my medical decision making (see chart for details).       43 year old female presents to the ED for evaluation of shortness of breath, chest pain, cough and fatigue.  Patient's boyfriend tested positive for Covid on Monday.  Vitals all  within normal limits.  Patient in no acute distress and non-ill appearing.  Lungs clear to auscultation bilaterally.  Patient speaking in full sentences with no accessory muscle usage.  Reproducible anterior chest wall tenderness.  Abdomen soft, nondistended, nontender.  Suspect patient's symptoms are related to Covid or another viral infection.  Will obtain chest x-ray, rapid Covid test, and EKG.  PERC negative and low risk with Wells criteria.  Low concern for PE/DVT. Atypical chest pain, doubt ACS.   EKG personally reviewed which demonstrates sinus arrythmia with PVCs but no signs of ischemia.  Given patient is having reproducible anterior chest wall tenderness suspect chest pain is related to inflammation from a viral infection.  Could also be related to GERD given patient has a burning sensation from her xiphoid process up to her throat.  Chest x-ray personally reviewed which is negative for signs of infection such as pneumonia.  Rapid Covid test negative, will send for outpatient Covid test. Patient ambulated in room and maintained O2 saturation >95%.   Will discharge patient with symptomatic treatment for cough and GERD. Patient advised to follow-up with PCP if symptoms do not improve within the next week. [facility]  number given to patient at discharge. Patient instructed to self-quarantine until COVID results are available. Strict ED precautions discussed with patient. Patient states understanding and agrees to plan. Patient discharged home in no acute distress and stable vitals   Jill [name] was evaluated in Emergency Department on 06/29/2019 for the symptoms described in the history of present illness. She was evaluated in the context of the global COVID-19 pandemic, which necessitated consideration that the patient might be at risk for infection with the SARS-CoV-2 virus that causes COVID-19. Institutional protocols and algorithms that pertain to the evaluation of patients at risk for COVID-19 are in a state of rapid change based on information released by regulatory bodies including the CDC and federal and state organizations. These policies and algorithms were followed during the patient's care in the ED.   Final Clinical Impressions(s) / ED Diagnoses   Final diagnoses:  Suspected COVID-19 virus infection  Gastroesophageal reflux disease, unspecified whether esophagitis present  Cough    ED Discharge Orders         Ordered    benzonatate (TESSALON) 100 MG capsule  Every 8 hours     06/29/19 1337    pantoprazole (PROTONIX) 20 MG tablet  Daily     06/29/19 [id number]           Cheek, Michaeal Davis B, PA-C 06/29/19 2045    Virgina Norfolkuratolo, Adam, DO 06/30/19 [phone]

## 2019-06-29 NOTE — Discharge Instructions (Addendum)
As discussed, your rapid Covid test was negative here today.  I have sent out a outpatient Covid test.  You will find out your results in the next 24 to 48 hours.  I have sent a prescription for cough medication to your pharmacy.  Use as prescribed.  I have also prescribed you some reflux medication.  You may take it once a day.  You may take over-the-counter Tylenol if you develop a fever.  Continue to self isolate until your Covid results come back.  Follow-up with your PCP if your symptoms do not improve within the next week.  I have included the number for [facility]. Call to schedule an appointment to follow-up with your reflux symptoms. You may need to be on more long term treatment.  Return to the ER for new or worsening symptoms.

## 2019-06-29 NOTE — ED Notes (Signed)
ED Provider at bedside. 

## 2019-06-29 NOTE — ED Triage Notes (Signed)
Patient reports chest pain and shortness of breath which began this morning.  Patient ambulatory to room in NAD, speaking in full sentences without difficulty.  Patient reports boyfriend tested positive for covid on Monday.

## 2019-07-02 LAB — NOVEL CORONAVIRUS, NAA (HOSP ORDER, SEND-OUT TO REF LAB; TAT 18-24 HRS): SARS-CoV-2, NAA: DETECTED — AB

## 2020-01-11 ENCOUNTER — Emergency Department (HOSPITAL_BASED_OUTPATIENT_CLINIC_OR_DEPARTMENT_OTHER): Payer: Medicaid Other

## 2020-01-11 ENCOUNTER — Encounter (HOSPITAL_BASED_OUTPATIENT_CLINIC_OR_DEPARTMENT_OTHER): Payer: Self-pay | Admitting: Emergency Medicine

## 2020-01-11 ENCOUNTER — Other Ambulatory Visit: Payer: Self-pay

## 2020-01-11 ENCOUNTER — Emergency Department (HOSPITAL_BASED_OUTPATIENT_CLINIC_OR_DEPARTMENT_OTHER)
Admission: EM | Admit: 2020-01-11 | Discharge: 2020-01-11 | Disposition: A | Discharge status: Home / Self Care | Payer: Medicaid Other | Attending: Emergency Medicine | Admitting: Emergency Medicine

## 2020-01-11 DIAGNOSIS — R519 Headache, unspecified: Secondary | ICD-10-CM | POA: Diagnosis not present

## 2020-01-11 DIAGNOSIS — R202 Paresthesia of skin: Secondary | ICD-10-CM | POA: Diagnosis not present

## 2020-01-11 DIAGNOSIS — R112 Nausea with vomiting, unspecified: Secondary | ICD-10-CM | POA: Diagnosis not present

## 2020-01-11 LAB — CBC WITH DIFFERENTIAL/PLATELET
Abs Immature Granulocytes: 0.02 10*3/uL (ref 0.00–0.07)
Basophils Absolute: 0 10*3/uL (ref 0.0–0.1)
Basophils Relative: 0 %
Eosinophils Absolute: 0.1 10*3/uL (ref 0.0–0.5)
Eosinophils Relative: 1 %
HCT: 39.4 % (ref 36.0–46.0)
Hemoglobin: 13.5 g/dL (ref 12.0–15.0)
Immature Granulocytes: 0 %
Lymphocytes Relative: 34 %
Lymphs Abs: 2.7 10*3/uL (ref 0.7–4.0)
MCH: 30.8 pg (ref 26.0–34.0)
MCHC: 34.3 g/dL (ref 30.0–36.0)
MCV: 90 fL (ref 80.0–100.0)
Monocytes Absolute: 0.6 10*3/uL (ref 0.1–1.0)
Monocytes Relative: 7 %
Neutro Abs: 4.4 10*3/uL (ref 1.7–7.7)
Neutrophils Relative %: 58 %
Platelets: 275 10*3/uL (ref 150–400)
RBC: 4.38 MIL/uL (ref 3.87–5.11)
RDW: 12.2 % (ref 11.5–15.5)
WBC: 7.8 10*3/uL (ref 4.0–10.5)
nRBC: 0 % (ref 0.0–0.2)

## 2020-01-11 LAB — BASIC METABOLIC PANEL
Anion gap: 8 (ref 5–15)
BUN: 9 mg/dL (ref 6–20)
CO2: 28 mmol/L (ref 22–32)
Calcium: 8.8 mg/dL — ABNORMAL LOW (ref 8.9–10.3)
Chloride: 98 mmol/L (ref 98–111)
Creatinine, Ser: 0.82 mg/dL (ref 0.44–1.00)
GFR calc Af Amer: >60 mL/min (ref >60)
GFR calc non Af Amer: >60 mL/min (ref >60)
Glucose, Bld: 80 mg/dL (ref 70–99)
Potassium: 3.2 mmol/L — ABNORMAL LOW (ref 3.5–5.1)
Sodium: 134 mmol/L — ABNORMAL LOW (ref 135–145)

## 2020-01-11 LAB — URINALYSIS, ROUTINE W REFLEX MICROSCOPIC
Bilirubin Urine: NEGATIVE
Glucose, UA: NEGATIVE mg/dL
Hgb urine dipstick: NEGATIVE
Ketones, ur: NEGATIVE mg/dL
Leukocytes,Ua: NEGATIVE
Nitrite: NEGATIVE
Protein, ur: NEGATIVE mg/dL
Specific Gravity, Urine: 1.02 (ref 1.005–1.030)
pH: 7 (ref 5.0–8.0)

## 2020-01-11 LAB — PREGNANCY, URINE: Preg Test, Ur: NEGATIVE

## 2020-01-11 MED ORDER — PROCHLORPERAZINE EDISYLATE 10 MG/2ML IJ SOLN
10.0000 mg | Freq: Once | INTRAMUSCULAR | Status: AC
  Administered 2020-01-11: 10 mg via INTRAVENOUS
  Filled 2020-01-11: qty 2

## 2020-01-11 MED ORDER — DIPHENHYDRAMINE HCL 50 MG/ML IJ SOLN
25.0000 mg | Freq: Once | INTRAMUSCULAR | Status: AC
  Administered 2020-01-11: 25 mg via INTRAVENOUS
  Filled 2020-01-11: qty 1

## 2020-01-11 MED ORDER — GABAPENTIN 300 MG PO CAPS
300.0000 mg | ORAL_CAPSULE | Freq: Once | ORAL | Status: AC
  Administered 2020-01-11: 300 mg via ORAL
  Filled 2020-01-11: qty 1

## 2020-01-11 MED ORDER — SODIUM CHLORIDE 0.9 % IV BOLUS
1000.0000 mL | Freq: Once | INTRAVENOUS | Status: AC
  Administered 2020-01-11: 1000 mL via INTRAVENOUS

## 2020-01-11 NOTE — ED Triage Notes (Signed)
Pt here with hx of TBI presenting with new and concerning sx of right sided head numbness and burning that extends down to arm and into torso just on that side. States it was hard to form words. This occurred yesterday and resolved, but since returned at 11am this morning.

## 2020-01-11 NOTE — ED Notes (Signed)
ED Provider at bedside. 
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
## 2020-01-11 NOTE — Discharge Instructions (Signed)
Your imaging today did not show any acute intracranial abnormality but it did show the possible hypoattenuation which may be related to your prior traumatic brain injury.  As your symptoms have started to improve after the medications and your labs are otherwise reassuring, we feel you are safe for discharge home however we do want you to call your primary doctor and your neurologist tomorrow to discuss your medications.  Please rest and stay hydrated.  We are giving you a different neurology number if you like to call for other follow-up.  If any symptoms change or worsen, please return to the nearest emergency department.

## 2020-01-11 NOTE — ED Notes (Signed)
Pt amb to [facility]

## 2020-01-12 LAB — URINE CULTURE: Culture: <10000 — AB

## 2020-03-20 ENCOUNTER — Telehealth: Payer: Self-pay | Admitting: Neurology

## 2020-03-20 ENCOUNTER — Encounter: Payer: Self-pay | Admitting: Neurology

## 2020-03-20 ENCOUNTER — Ambulatory Visit: Payer: Medicaid Other | Admitting: Neurology

## 2020-03-20 VITALS — BP 109/54 | HR 102 | Ht 65.0 in | Wt 198.0 lb

## 2020-03-20 DIAGNOSIS — Z87820 Personal history of traumatic brain injury: Secondary | ICD-10-CM | POA: Diagnosis not present

## 2020-03-20 DIAGNOSIS — G4440 Drug-induced headache, not elsewhere classified, not intractable: Secondary | ICD-10-CM

## 2020-03-20 DIAGNOSIS — G43711 Chronic migraine without aura, intractable, with status migrainosus: Secondary | ICD-10-CM | POA: Diagnosis not present

## 2020-03-20 DIAGNOSIS — G4452 New daily persistent headache (NDPH): Secondary | ICD-10-CM | POA: Diagnosis not present

## 2020-03-20 MED ORDER — SUMATRIPTAN SUCCINATE 100 MG PO TABS: ORAL_TABLET | ORAL | 2 refills | Status: DC

## 2020-03-20 MED ORDER — GABAPENTIN 100 MG PO CAPS: 100.0000 mg | ORAL_CAPSULE | Freq: Every day | ORAL | 5 refills | Status: DC
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
## 2020-03-20 NOTE — Patient Instructions (Signed)
Sumatriptan tablets What is this medicine? SUMATRIPTAN (soo ma TRIP tan) is used to treat migraines with or without aura. An aura is a strange feeling or visual disturbance that warns you of an attack. It is not used to prevent migraines. This medicine may be used for other purposes; ask your health care provider or pharmacist if you have questions. COMMON BRAND NAME(S): Imitrex, Migraine Pack What should I tell my health care provider before I take this medicine? They need to know if you have any of these conditions:  cigarette smoker  circulation problems in fingers and toes  diabetes  heart disease  high blood pressure  high cholesterol  history of irregular heartbeat  history of stroke  kidney disease  liver disease  stomach or intestine problems  an unusual or allergic reaction to sumatriptan, other medicines, foods, dyes, or preservatives  pregnant or trying to get pregnant  breast-feeding How should I use this medicine? Take this medicine by mouth with a glass of water. Follow the directions on the prescription label. Do not take it more often than directed. Talk to your pediatrician regarding the use of this medicine in children. Special care may be needed. Overdosage: If you think you have taken too much of this medicine contact a poison control center or emergency room at once. NOTE: This medicine is only for you. Do not share this medicine with others. What if I miss a dose? This does not apply. This medicine is not for regular use. What may interact with this medicine? Do not take this medicine with any of the following medicines:  certain medicines for migraine headache like almotriptan, eletriptan, frovatriptan, naratriptan, rizatriptan, sumatriptan, zolmitriptan  ergot alkaloids like dihydroergotamine, ergonovine, ergotamine, methylergonovine  MAOIs like Carbex, Eldepryl, Marplan, Nardil, and Parnate This medicine may also interact with the following  medications:  certain medicines for depression, anxiety, or psychotic disorders This list may not describe all possible interactions. Give your health care provider a list of all the medicines, herbs, non-prescription drugs, or dietary supplements you use. Also tell them if you smoke, drink alcohol, or use illegal drugs. Some items may interact with your medicine. What should I watch for while using this medicine? Visit your healthcare professional for regular checks on your progress. Tell your healthcare professional if your symptoms do not start to get better or if they get worse. You may get drowsy or dizzy. Do not drive, use machinery, or do anything that needs mental alertness until you know how this medicine affects you. Do not stand up or sit up quickly, especially if you are an older patient. This reduces the risk of dizzy or fainting spells. Alcohol may interfere with the effect of this medicine. Tell your healthcare professional right away if you have any change in your eyesight. If you take migraine medicines for 10 or more days a month, your migraines may get worse. Keep a diary of headache days and medicine use. Contact your healthcare professional if your migraine attacks occur more frequently. What side effects may I notice from receiving this medicine? Side effects that you should report to your doctor or health care professional as soon as possible:  allergic reactions like skin rash, itching or hives, swelling of the face, lips, or tongue  changes in vision  chest pain or chest tightness  signs and symptoms of a dangerous change in heartbeat or heart rhythm like chest pain; dizziness; fast, irregular heartbeat; palpitations; feeling faint or lightheaded; falls; breathing problems  signs  and symptoms of a stroke like changes in vision; confusion; trouble speaking or understanding; severe headaches; sudden numbness or weakness of the face, arm or leg; trouble walking; dizziness;  loss of balance or coordination  signs and symptoms of serotonin syndrome like irritable; confusion; diarrhea; fast or irregular heartbeat; muscle twitching; stiff muscles; trouble walking; sweating; high fever; seizures; chills; vomiting Side effects that usually do not require medical attention (report to your doctor or health care professional if they continue or are bothersome):  diarrhea  dizziness  drowsiness  dry mouth  headache  nausea, vomiting  pain, tingling, numbness in the hands or feet  stomach pain This list may not describe all possible side effects. Call your doctor for medical advice about side effects. You may report side effects to FDA at 1-800-FDA-1088. Where should I keep my medicine? Keep out of the reach of children. Store at room temperature between 2 and 30 degrees C (36 and 86 degrees F). Throw away any unused medicine after the expiration date. NOTE: This sheet is a summary. It may not cover all possible information. If you have questions about this medicine, talk to your doctor, pharmacist, or health care provider.  2020 Elsevier/Gold Standard (2018-01-26 15:05:37) Gabapentin capsules or tablets What is this medicine? GABAPENTIN (GA ba pen tin) is used to control seizures in certain types of epilepsy. It is also used to treat certain types of nerve pain. This medicine may be used for other purposes; ask your health care provider or pharmacist if you have questions. COMMON BRAND NAME(S): Active-PAC with Gabapentin, Gabarone, Neurontin What should I tell my health care provider before I take this medicine? They need to know if you have any of these conditions:  history of drug abuse or alcohol abuse problem  kidney disease  lung or breathing disease  suicidal thoughts, plans, or attempt; a previous suicide attempt by you or a family member  an unusual or allergic reaction to gabapentin, other medicines, foods, dyes, or preservatives  pregnant or  trying to get pregnant  breast-feeding How should I use this medicine? Take this medicine by mouth with a glass of water. Follow the directions on the prescription label. You can take it with or without food. If it upsets your stomach, take it with food. Take your medicine at regular intervals. Do not take it more often than directed. Do not stop taking except on your doctor's advice. If you are directed to break the 600 or 800 mg tablets in half as part of your dose, the extra half tablet should be used for the next dose. If you have not used the extra half tablet within 28 days, it should be thrown away. A special MedGuide will be given to you by the pharmacist with each prescription and refill. Be sure to read this information carefully each time. Talk to your pediatrician regarding the use of this medicine in children. While this drug may be prescribed for children as young as 3 years for selected conditions, precautions do apply. Overdosage: If you think you have taken too much of this medicine contact a poison control center or emergency room at once. NOTE: This medicine is only for you. Do not share this medicine with others. What if I miss a dose? If you miss a dose, take it as soon as you can. If it is almost time for your next dose, take only that dose. Do not take double or extra doses. What may interact with this medicine? This medicine  may interact with the following medications:  alcohol  antihistamines for allergy, cough, and cold  certain medicines for anxiety or sleep  certain medicines for depression like amitriptyline, fluoxetine, sertraline  certain medicines for seizures like phenobarbital, primidone  certain medicines for stomach problems  general anesthetics like halothane, isoflurane, methoxyflurane, propofol  local anesthetics like lidocaine, pramoxine, tetracaine  medicines that relax muscles for surgery  narcotic medicines for pain  phenothiazines like  chlorpromazine, mesoridazine, prochlorperazine, thioridazine This list may not describe all possible interactions. Give your health care provider a list of all the medicines, herbs, non-prescription drugs, or dietary supplements you use. Also tell them if you smoke, drink alcohol, or use illegal drugs. Some items may interact with your medicine. What should I watch for while using this medicine? Visit your doctor or health care provider for regular checks on your progress. You may want to keep a record at home of how you feel your condition is responding to treatment. You may want to share this information with your doctor or health care provider at each visit. You should contact your doctor or health care provider if your seizures get worse or if you have any new types of seizures. Do not stop taking this medicine or any of your seizure medicines unless instructed by your doctor or health care provider. Stopping your medicine suddenly can increase your seizures or their severity. This medicine may cause serious skin reactions. They can happen weeks to months after starting the medicine. Contact your health care provider right away if you notice fevers or flu-like symptoms with a rash. The rash may be red or purple and then turn into blisters or peeling of the skin. Or, you might notice a red rash with swelling of the face, lips or lymph nodes in your neck or under your arms. Wear a medical identification bracelet or chain if you are taking this medicine for seizures, and carry a card that lists all your medications. You may get drowsy, dizzy, or have blurred vision. Do not drive, use machinery, or do anything that needs mental alertness until you know how this medicine affects you. To reduce dizzy or fainting spells, do not sit or stand up quickly, especially if you are an older patient. Alcohol can increase drowsiness and dizziness. Avoid alcoholic drinks. Your mouth may get dry. Chewing sugarless gum or  sucking hard candy, and drinking plenty of water will help. The use of this medicine may increase the chance of suicidal thoughts or actions. Pay special attention to how you are responding while on this medicine. Any worsening of mood, or thoughts of suicide or dying should be reported to your health care provider right away. Women who become pregnant while using this medicine may enroll in the [place] [employer] Antiepileptic Drug Pregnancy Registry by calling [phone]. This registry collects information about the safety of antiepileptic drug use during pregnancy. What side effects may I notice from receiving this medicine? Side effects that you should report to your doctor or health care professional as soon as possible:  allergic reactions like skin rash, itching or hives, swelling of the face, lips, or tongue  breathing problems  rash, fever, and swollen lymph nodes  redness, blistering, peeling or loosening of the skin, including inside the mouth  suicidal thoughts, mood changes Side effects that usually do not require medical attention (report to your doctor or health care professional if they continue or are bothersome):  dizziness  drowsiness  headache  nausea, vomiting  swelling  of ankles, feet, hands  tiredness This list may not describe all possible side effects. Call your doctor for medical advice about side effects. You may report side effects to FDA at 1-800-FDA-1088. Where should I keep my medicine? Keep out of reach of children. This medicine may cause accidental overdose and death if it taken by other adults, children, or pets. Mix any unused medicine with a substance like cat litter or coffee grounds. Then throw the medicine away in a sealed container like a sealed bag or a coffee can with a lid. Do not use the medicine after the expiration date. Store at room temperature between 15 and 30 degrees C (59 and 86 degrees F). NOTE: This sheet is a summary. It may not  cover all possible information. If you have questions about this medicine, talk to your doctor, pharmacist, or health care provider.  2020 Elsevier/Gold Standard (2018-10-15 14:16:43) Analgesic Rebound Headache An analgesic rebound headache, sometimes called a medication overuse headache, is a headache that comes after pain medicine (analgesic) taken to treat the original (primary) headache has worn off. Any type of primary headache can return as a rebound headache if a person regularly takes analgesics more than three times a week to treat it. The types of primary headaches that are commonly associated with rebound headaches include:  Migraines.  Headaches that arise from tense muscles in the head and neck area (tension headaches).  Headaches that develop and happen again (recur) on one side of the head and around the eye (cluster headaches). If rebound headaches continue, they become chronic daily headaches. What are the causes? This condition may be caused by frequent use of:  Over-the-counter medicines such as aspirin, ibuprofen, and acetaminophen.  Sinus relief medicines and other medicines that contain caffeine.  Narcotic pain medicines such as codeine and oxycodone. What are the signs or symptoms? The symptoms of a rebound headache are the same as the symptoms of the original headache. Some of the symptoms of specific types of headaches include: Migraine headache  Pulsing or throbbing pain on one or both sides of the head.  Severe pain that interferes with daily activities.  Pain that is worsened by physical activity.  Nausea, vomiting, or both.  Pain with exposure to bright light, loud noises, or strong smells.  General sensitivity to bright light, loud noises, or strong smells.  Visual changes.  Numbness of one or both arms. Tension headache  Pressure around the head.  Dull, aching head pain.  Pain felt over the front and sides of the head.  Tenderness in the  muscles of the head, neck, and shoulders. Cluster headache  Severe pain that begins in or around one eye or temple.  Redness and tearing in the eye on the same side as the pain.  Droopy or swollen eyelid.  One-sided head pain.  Nausea.  Runny nose.  Sweaty, pale facial skin.  Restlessness. How is this diagnosed? This condition is diagnosed by:  Reviewing your medical history. This includes the nature of your primary headaches.  Reviewing the types of pain medicines that you have been using to treat your headaches and how often you take them. How is this treated? This condition may be treated or managed by:  Discontinuing frequent use of the analgesic medicine. Doing this may worsen your headaches at first, but the pain should eventually become more manageable, less frequent, and less severe.  Seeing a headache specialist. He or she may be able to help you manage your headaches  and help make sure there is not another cause of the headaches.  Using methods of stress relief, such as acupuncture, counseling, biofeedback, and massage. Talk with your health care provider about which methods might be good for you. Follow these instructions at home:  Take over-the-counter and prescription medicines only as told by your health care provider.  Stop the repeated use of pain medicine as told by your health care provider. Stopping can be difficult. Carefully follow instructions from your health care provider.  Avoid triggers that are known to cause your primary headaches.  Keep all follow-up visits as told by your health care provider. This is important. Contact a health care provider if:  You continue to experience headaches after following treatments that your health care provider recommended. Get help right away if:  You develop new headache pain.  You develop headache pain that is different than what you have experienced in the past.  You develop numbness or tingling in your  arms or legs.  You develop changes in your speech or vision. This information is not intended to replace advice given to you by your health care provider. Make sure you discuss any questions you have with your health care provider. Document Revised: 06/26/2017 Document Reviewed: 12/17/2015 Elsevier Patient Education  2020 Elsevier Inc. Recurrent Migraine Headache  Migraines are a type of headache, and they are usually stronger and more sudden than normal headaches (tension headaches). Migraines are characterized by an intense pulsing, throbbing pain that is usually only present on one side of the head. Sometimes, migraine headaches can cause nausea, vomiting, sensitivity to light and sound, and vision changes. Recurrent migraines keep coming back (recurring). A migraine can last from 4 hours up to 3 days. What are the causes? The exact cause of this condition is not known. However, a migraine may be caused when nerves in the brain become irritated and release chemicals that cause inflammation of blood vessels. This inflammation causes pain. Certain things may also trigger migraines, such as:  A disruption in your regular eating and sleeping schedule.  Smoking.  Stress.  Menstruation.  Certain foods and drinks, such as: ? Aged cheese. ? Chocolate. ? Alcohol. ? Caffeine. ? Foods or drinks that contain nitrates, glutamate, aspartame, MSG, or tyramine.  Lack of sleep.  Hunger.  Physical exertion.  Fatigue.  High altitude.  Weather changes.  Medicines, such as: ? Nitroglycerin, which is used to treat chest pain. ? Birth control pills. ? Estrogen. ? Some blood pressure medicines. What are the signs or symptoms? Symptoms of this condition vary for each person and may include:  Pain that is usually only present on one side of the head. In some cases, the pain may be on both sides of the head or around the head or neck.  Pulsating or throbbing pain.  Severe pain that  prevents daily activities.  Pain that is aggravated by any physical activity.  Nausea, vomiting, or both.  Dizziness.  Pain with exposure to bright lights, loud noises, or activity.  General sensitivity to bright lights, loud noises, or smells. Before you get a migraine, you may get warning signs that a migraine is coming (aura). An aura may include:  Seeing flashing lights.  Seeing bright spots, halos, or zigzag lines.  Having tunnel vision or blurred vision.  Having numbness or a tingling feeling.  Having trouble talking.  Having muscle weakness.  Smelling a certain odor. How is this diagnosed? This condition is often diagnosed based on:  Your  symptoms and medical history.  A physical exam. You may also have tests, including:  A CT scan or MRI of your brain. These imaging tests cannot diagnose migraines, but they can help to rule out other causes of headaches.  Blood tests. How is this treated? This condition is treated with:  Medicines. These are used for: ? Lessening pain and nausea. ? Preventing recurrent migraines.  Lifestyle changes, such as changes to your diet or sleeping patterns.  Behavior therapy, such as relaxation training or biofeedback. Biofeedback is a treatment that involves teaching you to relax and use your brain to lower your heart rate and control your breathing. Follow these instructions at home: Medicines  Take over-the-counter and prescription medicines only as told by your health care provider.  Do not drive or use heavy machinery while taking prescription pain medicine. Lifestyle  Do not use any products that contain nicotine or tobacco, such as cigarettes and e-cigarettes. If you need help quitting, ask your health care provider.  Limit alcohol intake to no more than 1 drink a day for nonpregnant women and 2 drinks a day for men. One drink equals 12 oz of beer, 5 oz of wine, or 1 oz of hard liquor.  Get 7-9 hours of sleep each  night, or the amount of sleep recommended by your health care provider.  Limit your stress. Talk with your health care provider if you need help with stress management.  Maintain a healthy weight. If you need help losing weight, ask your health care provider.  Exercise regularly. Aim for 150 minutes of moderate-intensity exercise (walking, biking, yoga) or 75 minutes of vigorous exercise (running, circuit training, swimming) each week. General instructions   Keep a journal to find out what triggers your migraine headaches so you can avoid these triggers. For example, write down: ? What you eat and drink. ? How much sleep you get. ? Any change to your diet or medicines.  Lie down in a dark, quiet room when you have a migraine.  Try placing a cool towel over your head when you have a migraine.  Keep lights dim, if bright lights bother you and make your migraines worse.  Keep all follow-up visits as told by your health care provider. This is important. Contact a health care provider if:  Your pain does not improve, even with medicine.  Your migraines continue to return, even with medicine.  You have a fever.  You have weight loss. Get help right away if:  Your migraine becomes severe and medicine does not help.  You have a stiff neck.  You have a loss of vision.  You have muscle weakness or loss of muscle control.  You start losing your balance or have trouble walking.  You feel faint or you pass out.  You develop new, severe symptoms.  You start having abrupt severe headaches that last for a second or less, like a thunderclap. Summary  Migraine headaches are usually stronger and more sudden than normal headaches (tension headaches). Migraines are characterized by an intense pulsing, throbbing pain that is usually only present on one side of the head.  The exact cause of this condition is not known. However, a migraine may be caused when nerves in the brain become  irritated and release chemicals that cause inflammation of blood vessels.  Certain things may trigger migraines, such as changes to diet or sleeping patterns, smoking, certain foods, alcohol, stress, and certain medicines.  Sometimes, migraine headaches can cause nausea, vomiting,  sensitivity to light and sound, and vision changes.  Migraines are often diagnosed based on your symptoms, medical history, and a physical exam. This information is not intended to replace advice given to you by your health care provider. Make sure you discuss any questions you have with your health care provider. Document Revised: 07/17/2017 Document Reviewed: 04/25/2016 Elsevier Patient Education  2020 [employer].

## 2020-03-20 NOTE — Telephone Encounter (Signed)
[facility] order sent to [facility]. No auth they will reach out to the patient to schedule.

## 2020-04-08 ENCOUNTER — Ambulatory Visit
Admission: RE | Admit: 2020-04-08 | Discharge: 2020-04-08 | Disposition: A | Discharge status: Home / Self Care | Payer: Medicaid Other | Source: Ambulatory Visit | Attending: Neurology | Admitting: Neurology

## 2020-04-08 DIAGNOSIS — G4440 Drug-induced headache, not elsewhere classified, not intractable: Secondary | ICD-10-CM

## 2020-04-08 DIAGNOSIS — G43711 Chronic migraine without aura, intractable, with status migrainosus: Secondary | ICD-10-CM

## 2020-04-08 DIAGNOSIS — G4452 New daily persistent headache (NDPH): Secondary | ICD-10-CM

## 2020-04-08 DIAGNOSIS — Z87820 Personal history of traumatic brain injury: Secondary | ICD-10-CM

## 2020-04-08 MED ORDER — GADOBENATE DIMEGLUMINE 529 MG/ML IV SOLN
16.0000 mL | Freq: Once | INTRAVENOUS | Status: AC | PRN
  Administered 2020-04-08: 16 mL via INTRAVENOUS

## 2020-04-10 ENCOUNTER — Telehealth: Payer: Self-pay | Admitting: Neurology

## 2020-04-10 NOTE — Telephone Encounter (Signed)
Called the patient to review the MRI results with her. There was no answer and the mailbox was full.  ** If pt returns call please advise that the MRI of the brain was normal. There was nothing that appeared concerning.

## 2020-04-10 NOTE — Progress Notes (Signed)
After the infusion of contrast material, a normal enhancement pattern is noted.  Compared to the MRI from 02/20/2016, there are no changes.  IMPRESSION: This is a normal MRI of the brain with and without contrast.  INTERPRETING PHYSICIAN:  Richard A. Epimenio Foot, MD, PhD, [place]

## 2020-04-10 NOTE — Telephone Encounter (Signed)
-----   Message from Melvyn Novas, MD sent at 04/10/2020 12:35 PM EDT ----- After the infusion of contrast material, a normal enhancement pattern is noted.  Compared to the MRI from 02/20/2016, there are no changes.  IMPRESSION: This is a normal MRI of the brain with and without contrast.  INTERPRETING PHYSICIAN:  Richard A. Epimenio Foot, MD, PhD, [place]

## 2020-04-11 NOTE — Telephone Encounter (Signed)
Called the patient and advised that the MRI was normal. Pt verbalized understanding. Pt had no questions at this time but was encouraged to call back if questions arise.

## 2020-06-27 ENCOUNTER — Encounter: Payer: Self-pay | Admitting: Family Medicine

## 2020-06-27 ENCOUNTER — Ambulatory Visit: Payer: Medicaid Other | Admitting: Family Medicine

## 2020-09-02 ENCOUNTER — Emergency Department (HOSPITAL_BASED_OUTPATIENT_CLINIC_OR_DEPARTMENT_OTHER): Payer: Medicaid Other

## 2020-09-02 ENCOUNTER — Encounter (HOSPITAL_BASED_OUTPATIENT_CLINIC_OR_DEPARTMENT_OTHER): Payer: Self-pay | Admitting: Emergency Medicine

## 2020-09-02 ENCOUNTER — Emergency Department (HOSPITAL_BASED_OUTPATIENT_CLINIC_OR_DEPARTMENT_OTHER)
Admission: EM | Admit: 2020-09-02 | Discharge: 2020-09-02 | Disposition: A | Discharge status: Home / Self Care | Payer: Medicaid Other | Attending: Emergency Medicine | Admitting: Emergency Medicine

## 2020-09-02 ENCOUNTER — Other Ambulatory Visit: Payer: Self-pay

## 2020-09-02 DIAGNOSIS — R0602 Shortness of breath: Secondary | ICD-10-CM | POA: Insufficient documentation

## 2020-09-02 DIAGNOSIS — R0789 Other chest pain: Secondary | ICD-10-CM | POA: Diagnosis present

## 2020-09-02 LAB — CBC
HCT: 41.8 % (ref 36.0–46.0)
Hemoglobin: 14.2 g/dL (ref 12.0–15.0)
MCH: 30 pg (ref 26.0–34.0)
MCHC: 34 g/dL (ref 30.0–36.0)
MCV: 88.2 fL (ref 80.0–100.0)
Platelets: 268 10*3/uL (ref 150–400)
RBC: 4.74 MIL/uL (ref 3.87–5.11)
RDW: 11.9 % (ref 11.5–15.5)
WBC: 5.6 10*3/uL (ref 4.0–10.5)
nRBC: 0 % (ref 0.0–0.2)

## 2020-09-02 LAB — BASIC METABOLIC PANEL
Anion gap: 9 (ref 5–15)
BUN: 8 mg/dL (ref 6–20)
CO2: 26 mmol/L (ref 22–32)
Calcium: 9.1 mg/dL (ref 8.9–10.3)
Chloride: 104 mmol/L (ref 98–111)
Creatinine, Ser: 0.67 mg/dL (ref 0.44–1.00)
GFR, Estimated: >60 mL/min (ref >60)
Glucose, Bld: 91 mg/dL (ref 70–99)
Potassium: 3.3 mmol/L — ABNORMAL LOW (ref 3.5–5.1)
Sodium: 139 mmol/L (ref 135–145)

## 2020-09-02 LAB — TROPONIN I (HIGH SENSITIVITY)
Troponin I (High Sensitivity): <2 ng/L (ref <18)
Troponin I (High Sensitivity): <2 ng/L (ref <18)

## 2020-09-02 LAB — MAGNESIUM: Magnesium: 1.8 mg/dL (ref 1.7–2.4)

## 2020-09-02 LAB — PREGNANCY, URINE: Preg Test, Ur: NEGATIVE

## 2020-09-02 MED ORDER — PANTOPRAZOLE SODIUM 20 MG PO TBEC: 20.0000 mg | DELAYED_RELEASE_TABLET | Freq: Every day | ORAL | 0 refills | Status: DC

## 2020-09-02 MED ORDER — ALUM & MAG HYDROXIDE-SIMETH 200-200-20 MG/5ML PO SUSP
30.0000 mL | Freq: Once | ORAL | Status: AC
  Administered 2020-09-02: 30 mL via ORAL
  Filled 2020-09-02: qty 30

## 2020-09-02 MED ORDER — SODIUM CHLORIDE 0.9 % IV BOLUS
500.0000 mL | Freq: Once | INTRAVENOUS | Status: AC
  Administered 2020-09-02: 500 mL via INTRAVENOUS

## 2020-09-02 MED ORDER — LIDOCAINE VISCOUS HCL 2 % MT SOLN
15.0000 mL | Freq: Once | OROMUCOSAL | Status: AC
  Administered 2020-09-02: 15 mL via ORAL
  Filled 2020-09-02: qty 15

## 2020-09-02 MED ORDER — FAMOTIDINE IN NACL 20-0.9 MG/50ML-% IV SOLN
20.0000 mg | Freq: Once | INTRAVENOUS | Status: AC
  Administered 2020-09-02: 20 mg via INTRAVENOUS
  Filled 2020-09-02: qty 50

## 2020-09-02 MED ORDER — PANTOPRAZOLE SODIUM 40 MG IV SOLR
40.0000 mg | Freq: Once | INTRAVENOUS | Status: AC
  Administered 2020-09-02: 40 mg via INTRAVENOUS
  Filled 2020-09-02: qty 40

## 2020-09-02 NOTE — ED Provider Notes (Addendum)
[facility] EMERGENCY DEPARTMENT Provider Note   CSN: [id number] Arrival date & time: 09/02/20  9407     History Chief Complaint  Patient presents with  . Chest Pain    Jill [name] is a 45 y.o. female history of obesity, tubal ligation, overactive bladder.  Patient reports around a week ago she was at her primary care doctor's office for a annual exam, she reports that at that time the provider noticed an irregular heart rate and referred her to cardiology she is scheduled to see them later on this month.  She had no complaints at that time.  Patient reports that yesterday around 8 PM she had gotten off of work and noticed a moderate left-sided chest pain she describes as a burning sensation that radiates up into her left shoulder this is been constant since onset no clear alleviating factors but she has not tried any medications prior to arrival she reported somewhat worse with motion.  She reports similar pain several years ago and reports she was seen in the ER at that time was told everything was "okay" and follow-up with her PCP.  Pain associated mild shortness of breath when pain is severe.   Denies fever/chills, recent illness, headache, vision changes, cough/hemoptysis, abdominal pain, nausea, vomiting, diaphoresis, extremity swelling/color change, history of blood clot, exogenous hormone use, history of cancer, recent surgery/immobilization, family history of heart disease under the age of 68, personal history of diabetes hypertension hyperlipidemia, personal history of heart disease, smoking, peripheral artery disease or any additional concerns. HPI     Past Medical History:  Diagnosis Date  . Head injury   . Overactive bladder     There are no problems to display for this patient.   Past Surgical History:  Procedure Laterality Date  . TUBAL LIGATION       OB History   No obstetric history on file.     No family history on file.  Social History    Tobacco Use  . Smoking status: Never Smoker  . Smokeless tobacco: Never Used  Vaping Use  . Vaping Use: Never used  Substance Use Topics  . Alcohol use: Yes    Comment: occ  . Drug use: Yes    Types: Marijuana    Comment: last used 03/20/2020    Home Medications Prior to Admission medications   Medication Sig Start Date End Date Taking? Authorizing Provider  pantoprazole (PROTONIX) 20 MG tablet Take 1 tablet (20 mg total) by mouth daily. 09/02/20 10/02/20 Yes Harlene Salts A, PA-C  gabapentin (NEURONTIN) 100 MG capsule Take 1 capsule (100 mg total) by mouth at bedtime. 03/20/20   Dohmeier, Porfirio Mylar, MD  imipramine (TOFRANIL) 25 MG tablet Take 75 mg by mouth at bedtime. 12/20/19   [provider]  naproxen (NAPROSYN) 500 MG tablet Take 1 tablet (500 mg total) by mouth 2 (two) times daily with a meal. As needed for pain 05/18/17   Linwood Dibbles, MD  oxybutynin (DITROPAN) 5 MG tablet Take 5 mg by mouth 2 (two) times daily. 12/20/19   [provider]  SUMAtriptan (IMITREX) 100 MG tablet May repeat in 2 hours if headache persists or recurs. 03/20/20   Dohmeier, Porfirio Mylar, MD    Allergies    Patient has no known allergies.  Review of Systems   Review of Systems Ten systems are reviewed and are negative for acute change except as noted in the HPI  Physical Exam Updated Vital Signs BP 134/81   Pulse 91  Temp 98.3 F (36.8 C) (Oral)   Resp (!) 22   Ht 5\' 5"  (1.651 m)   Wt 84.4 kg   LMP 08/26/2020   SpO2 100%   BMI 30.95 kg/m   Physical Exam Constitutional:      General: She is not in acute distress.    Appearance: Normal appearance. She is well-developed. She is not ill-appearing or diaphoretic.  HENT:     Head: Normocephalic and atraumatic.  Eyes:     General: Vision grossly intact. Gaze aligned appropriately.     Pupils: Pupils are equal, round, and reactive to light.  Neck:     Trachea: Trachea and phonation normal.  Cardiovascular:     Rate and Rhythm:  Normal rate and regular rhythm.     Pulses:          Radial pulses are 2+ on the right side and 2+ on the left side.       Dorsalis pedis pulses are 2+ on the right side and 2+ on the left side.     Heart sounds: Normal heart sounds.  Pulmonary:     Effort: Pulmonary effort is normal. No respiratory distress.     Breath sounds: Normal breath sounds.  Abdominal:     General: There is no distension.     Palpations: Abdomen is soft.     Tenderness: There is no abdominal tenderness. There is no guarding or rebound.  Musculoskeletal:        General: Normal range of motion.     Cervical back: Normal range of motion.     Right lower leg: No tenderness. No edema.     Left lower leg: No tenderness. No edema.  Skin:    General: Skin is warm and dry.  Neurological:     Mental Status: She is alert.     GCS: GCS eye subscore is 4. GCS verbal subscore is 5. GCS motor subscore is 6.     Comments: Speech is clear and goal oriented, follows commands Major Cranial nerves without deficit, no facial droop Moves extremities without ataxia, coordination intact  Psychiatric:        Behavior: Behavior normal.     ED Results / Procedures / Treatments   Labs (all labs ordered are listed, but only abnormal results are displayed) Labs Reviewed  BASIC METABOLIC PANEL - Abnormal; Notable for the following components:      Result Value   Potassium 3.3 (*)    All other components within normal limits  CBC  PREGNANCY, URINE  MAGNESIUM  TROPONIN I (HIGH SENSITIVITY)  TROPONIN I (HIGH SENSITIVITY)    EKG EKG Interpretation  Date/Time:  Sunday September 02 2020 09:37:14 EST Ventricular Rate:  87 PR Interval:    QRS Duration: 97 QT Interval:  389 QTC Calculation: 468 R Axis:   18 Text Interpretation: Sinus rhythm Ventricular premature complex LAE, consider biatrial enlargement RSR' in V1 or V2, right VCD or RVH No STEMI Confirmed by [id number] [phone]) on 09/02/2020 9:43:16 AM   Radiology DG  Chest 2 View  Result Date: 09/02/2020 CLINICAL DATA:  Left-sided chest pain EXAM: CHEST - 2 VIEW COMPARISON:  One-view chest x-ray 06/29/2019 FINDINGS: Heart size is normal. No edema or effusion is present. No focal airspace disease is evident. The visualized soft tissues and bony thorax are unremarkable. IMPRESSION: Negative two view chest x-ray. Electronically Signed   By: 14/08/2018 M.D.   On: 09/02/2020 10:22    Procedures Procedures  Medications Ordered in ED Medications  alum & mag hydroxide-simeth (MAALOX/MYLANTA) 200-200-20 MG/5ML suspension 30 mL (30 mLs Oral Given 09/02/20 1047)    And  lidocaine (XYLOCAINE) 2 % viscous mouth solution 15 mL (15 mLs Oral Given 09/02/20 1047)  famotidine (PEPCID) IVPB 20 mg premix (0 mg Intravenous Stopped 09/02/20 1309)  pantoprazole (PROTONIX) injection 40 mg (40 mg Intravenous Given 09/02/20 1222)  sodium chloride 0.9 % bolus 500 mL (0 mLs Intravenous Stopped 09/02/20 1312)    ED Course  I have reviewed the triage vital signs and the nursing notes.  Pertinent labs & imaging results that were available during my care of the patient were reviewed by me and considered in my medical decision making (see chart for details).    MDM Rules/Calculators/A&P                         Additional history obtained from: 1. Nursing notes from this visit. 2. EMR, unable to review patient's recent PCP visit.  I am able to see patients referral to cardiology 4 days ago, it does not appear she has seen cardiology yet. ------------------------ I ordered, reviewed and interpreted labs which include: CBC within normal limits, no leukocytosis to suggest infectious process, no anemia. Magnesium within normal limits. BMP shows no emergent electrolyte derangement, AKI or gap. High-sensitivity troponin negative x2, doubt ACS. Urine pregnancy test negative. Chest x-ray:  IMPRESSION:  Negative two view chest x-ray.   EKG: Sinus rhythm Ventricular premature  complex LAE, consider biatrial enlargement RSR' in V1 or V2, right VCD or RVH No STEMI Confirmed by Alvester Chou [phone]) on 09/02/2020 9:43:16 AM  Patient low risk by Anner Crete criteria PERC negative, vital signs stable.  Heart score less than 4.  Low suspicion for pulmonary embolism, PE, dissection, bacterial pneumonia, PTX, myocarditis or other emergent cardiopulmonary processes as etiology of patient's pain.  Possible reflux related pain as it is a burning sensation will start patient on Protonix and encourage close PCP follow-up.  Patient reported improvement of pain following Protonix/Pepcid in the ER.  Patient has cardiology referral through PCP for this coming week I encouraged patient to maintain that appointment and to also follow-up with her PCP for reevaluation.  Additionally patient without abdominal pain on exam and no history of nausea/vomiting, low suspicion for cholecystitis, pancreatitis, perforation or other emergent intra-abdominal etiology patient symptoms  At this time there does not appear to be any evidence of an acute emergency medical condition and the patient appears stable for discharge with appropriate outpatient follow up. Diagnosis was discussed with patient who verbalizes understanding of care plan and is agreeable to discharge. I have discussed return precautions with patient who verbalizes understanding. Patient encouraged to follow-up with their PCP. All questions answered.  Patient's case discussed with Dr. Renaye Rakers  who agrees with plan to discharge with follow-up.   Note: Portions of this report may have been transcribed using voice recognition software. Every effort was made to ensure accuracy; however, inadvertent computerized transcription errors may still be present. Final Clinical Impression(s) / ED Diagnoses Final diagnoses:  Atypical chest pain    Rx / DC Orders ED Discharge Orders         Ordered    pantoprazole (PROTONIX) 20 MG tablet  Daily        09/02/20  1325           Bill Salinas, [place] 09/02/20 1327    Bill Salinas, PA-C 09/02/20 1328  Terald Sleeper, MD 09/02/20 [phone]

## 2020-09-02 NOTE — ED Triage Notes (Signed)
L sided chest pain since last night. Is scheduled to f/u with cardiology due to an arrhythmia.

## 2020-09-02 NOTE — Discharge Instructions (Addendum)
At this time there does not appear to be the presence of an emergent medical condition, however there is always the potential for conditions to change. Please read and follow the below instructions.  Please return to the Emergency Department immediately for any new or worsening symptoms. Please be sure to follow up with your Primary Care Provider within one week regarding your visit today; please call their office to schedule an appointment even if you are feeling better for a follow-up visit. Please go to your cardiology follow-up as scheduled by your primary care provider. Please begin taking the medication Protonix as prescribed to help with acid reflux related symptoms.  Please drink plenty of water and get plenty of rest.  Go to the nearest Emergency Department immediately if: You have fever or chillsYour chest pain is worse. You have a cough that gets worse, or you cough up blood. You have very bad (severe) pain in your belly (abdomen). You pass out (faint). You have either of these for no clear reason: Sudden chest discomfort. Sudden discomfort in your arms, back, neck, or jaw. You have shortness of breath at any time. You suddenly start to sweat, or your skin gets clammy. You feel sick to your stomach (nauseous). You throw up (vomit). You suddenly feel lightheaded or dizzy. You feel very weak or tired. Your heart starts to beat fast, or it feels like it is skipping beats. You have any new/concerning or worsening of symptoms   Please read the additional information packets attached to your discharge summary.  Do not take your medicine if  develop an itchy rash, swelling in your mouth or lips, or difficulty breathing; call 911 and seek immediate emergency medical attention if this occurs.  You may review your lab tests and imaging results in their entirety on your MyChart account.  Please discuss all results of fully with your primary care provider and other specialist at your  follow-up visit.  Note: Portions of this text may have been transcribed using voice recognition software. Every effort was made to ensure accuracy; however, inadvertent computerized transcription errors may still be present.

## 2020-10-11 ENCOUNTER — Telehealth: Payer: Self-pay | Admitting: Neurology

## 2020-10-11 ENCOUNTER — Encounter: Payer: Self-pay | Admitting: Neurology

## 2020-10-11 ENCOUNTER — Ambulatory Visit: Payer: Medicaid Other | Admitting: Neurology

## 2020-10-11 DIAGNOSIS — G43501 Persistent migraine aura without cerebral infarction, not intractable, with status migrainosus: Secondary | ICD-10-CM

## 2020-10-11 DIAGNOSIS — G44329 Chronic post-traumatic headache, not intractable: Secondary | ICD-10-CM | POA: Diagnosis not present

## 2020-10-11 DIAGNOSIS — G44309 Post-traumatic headache, unspecified, not intractable: Secondary | ICD-10-CM | POA: Insufficient documentation

## 2020-10-11 MED ORDER — TOPIRAMATE 25 MG PO TABS: ORAL_TABLET | ORAL | 3 refills | Status: DC

## 2020-10-11 MED ORDER — SUMATRIPTAN SUCCINATE 100 MG PO TABS: ORAL_TABLET | ORAL | 2 refills | Status: DC

## 2020-10-11 MED ORDER — GABAPENTIN 100 MG PO CAPS: 100.0000 mg | ORAL_CAPSULE | Freq: Three times a day (TID) | ORAL | 5 refills | Status: DC

## 2020-10-11 NOTE — Patient Instructions (Signed)
Gabapentin capsules or tablets What is this medicine? GABAPENTIN (GA ba pen tin) is used to control seizures in certain types of epilepsy. It is also used to treat certain types of nerve pain. This medicine may be used for other purposes; ask your health care provider or pharmacist if you have questions. COMMON BRAND NAME(S): Active-PAC with Gabapentin, Orpha Bur, Gralise, Neurontin What should I tell my health care provider before I take this medicine? They need to know if you have any of these conditions:  history of drug abuse or alcohol abuse problem  kidney disease  lung or breathing disease  suicidal thoughts, plans, or attempt; a previous suicide attempt by you or a family member  an unusual or allergic reaction to gabapentin, other medicines, foods, dyes, or preservatives  pregnant or trying to get pregnant  breast-feeding How should I use this medicine? Take this medicine by mouth with a glass of water. Follow the directions on the prescription label. You can take it with or without food. If it upsets your stomach, take it with food. Take your medicine at regular intervals. Do not take it more often than directed. Do not stop taking except on your doctor's advice. If you are directed to break the 600 or 800 mg tablets in half as part of your dose, the extra half tablet should be used for the next dose. If you have not used the extra half tablet within 28 days, it should be thrown away. A special MedGuide will be given to you by the pharmacist with each prescription and refill. Be sure to read this information carefully each time. Talk to your pediatrician regarding the use of this medicine in children. While this drug may be prescribed for children as young as 3 years for selected conditions, precautions do apply. Overdosage: If you think you have taken too much of this medicine contact a poison control center or emergency room at once. NOTE: This medicine is only for you. Do not  share this medicine with others. What if I miss a dose? If you miss a dose, take it as soon as you can. If it is almost time for your next dose, take only that dose. Do not take double or extra doses. What may interact with this medicine? This medicine may interact with the following medications:  alcohol  antihistamines for allergy, cough, and cold  certain medicines for anxiety or sleep  certain medicines for depression like amitriptyline, fluoxetine, sertraline  certain medicines for seizures like phenobarbital, primidone  certain medicines for stomach problems  general anesthetics like halothane, isoflurane, methoxyflurane, propofol  local anesthetics like lidocaine, pramoxine, tetracaine  medicines that relax muscles for surgery  narcotic medicines for pain  phenothiazines like chlorpromazine, mesoridazine, prochlorperazine, thioridazine This list may not describe all possible interactions. Give your health care provider a list of all the medicines, herbs, non-prescription drugs, or dietary supplements you use. Also tell them if you smoke, drink alcohol, or use illegal drugs. Some items may interact with your medicine. What should I watch for while using this medicine? Visit your doctor or health care provider for regular checks on your progress. You may want to keep a record at home of how you feel your condition is responding to treatment. You may want to share this information with your doctor or health care provider at each visit. You should contact your doctor or health care provider if your seizures get worse or if you have any new types of seizures. Do not stop  taking this medicine or any of your seizure medicines unless instructed by your doctor or health care provider. Stopping your medicine suddenly can increase your seizures or their severity. This medicine may cause serious skin reactions. They can happen weeks to months after starting the medicine. Contact your health  care provider right away if you notice fevers or flu-like symptoms with a rash. The rash may be red or purple and then turn into blisters or peeling of the skin. Or, you might notice a red rash with swelling of the face, lips or lymph nodes in your neck or under your arms. Wear a medical identification bracelet or chain if you are taking this medicine for seizures, and carry a card that lists all your medications. You may get drowsy, dizzy, or have blurred vision. Do not drive, use machinery, or do anything that needs mental alertness until you know how this medicine affects you. To reduce dizzy or fainting spells, do not sit or stand up quickly, especially if you are an older patient. Alcohol can increase drowsiness and dizziness. Avoid alcoholic drinks. Your mouth may get dry. Chewing sugarless gum or sucking hard candy, and drinking plenty of water will help. The use of this medicine may increase the chance of suicidal thoughts or actions. Pay special attention to how you are responding while on this medicine. Any worsening of mood, or thoughts of suicide or dying should be reported to your health care provider right away. Women who become pregnant while using this medicine may enroll in the [place] [employer] Antiepileptic Drug Pregnancy Registry by calling [phone]. This registry collects information about the safety of antiepileptic drug use during pregnancy. What side effects may I notice from receiving this medicine? Side effects that you should report to your doctor or health care professional as soon as possible:  allergic reactions like skin rash, itching or hives, swelling of the face, lips, or tongue  breathing problems  rash, fever, and swollen lymph nodes  redness, blistering, peeling or loosening of the skin, including inside the mouth  suicidal thoughts, mood changes Side effects that usually do not require medical attention (report to your doctor or health care professional if  they continue or are bothersome):  dizziness  drowsiness  headache  nausea, vomiting  swelling of ankles, feet, hands  tiredness This list may not describe all possible side effects. Call your doctor for medical advice about side effects. You may report side effects to FDA at 1-800-FDA-1088. Where should I keep my medicine? Keep out of reach of children. This medicine may cause accidental overdose and death if it taken by other adults, children, or pets. Mix any unused medicine with a substance like cat litter or coffee grounds. Then throw the medicine away in a sealed container like a sealed bag or a coffee can with a lid. Do not use the medicine after the expiration date. Store at room temperature between 15 and 30 degrees C (59 and 86 degrees F). NOTE: This sheet is a summary. It may not cover all possible information. If you have questions about this medicine, talk to your doctor, pharmacist, or health care provider.  2021 Elsevier/Gold Standard (2018-10-15 14:16:43) Topiramate tablets What is this medicine? TOPIRAMATE (toe PYRE a mate) is used to treat seizures in adults or children with epilepsy. It is also used for the prevention of migraine headaches. This medicine may be used for other purposes; ask your health care provider or pharmacist if you have questions. COMMON BRAND NAME(S): Topamax,  Topiragen What should I tell my health care provider before I take this medicine? They need to know if you have any of these conditions:  bleeding disorder  kidney disease  lung disease  suicidal thoughts, plans, or attempt  an unusual or allergic reaction to topiramate, other medicines, foods, dyes, or preservatives  pregnant or trying to get pregnant  breast-feeding How should I use this medicine? Take this medicine by mouth with a glass of water. Follow the directions on the prescription label. Do not cut, crush or chew this medicine. Swallow the tablets whole. You can take  it with or without food. If it upsets your stomach, take it with food. Take your medicine at regular intervals. Do not take it more often than directed. Do not stop taking except on your doctor's advice. A special MedGuide will be given to you by the pharmacist with each prescription and refill. Be sure to read this information carefully each time. Talk to your pediatrician regarding the use of this medicine in children. While this drug may be prescribed for children as young as 402 years of age for selected conditions, precautions do apply. Overdosage: If you think you have taken too much of this medicine contact a poison control center or emergency room at once. NOTE: This medicine is only for you. Do not share this medicine with others. What if I miss a dose? If you miss a dose, take it as soon as you can. If your next dose is to be taken in less than 6 hours, then do not take the missed dose. Take the next dose at your regular time. Do not take double or extra doses. What may interact with this medicine? This medicine may interact with the following medications:  acetazolamide  alcohol  antihistamines for allergy, cough, and cold  aspirin and aspirin-like medicines  atropine  birth control pills  certain medicines for anxiety or sleep  certain medicines for bladder problems like oxybutynin, tolterodine  certain medicines for depression like amitriptyline, fluoxetine, sertraline  certain medicines for seizures like carbamazepine, phenobarbital, phenytoin, primidone, valproic acid, zonisamide  certain medicines for stomach problems like dicyclomine, hyoscyamine  certain medicines for travel sickness like scopolamine  certain medicines for Parkinson's disease like benztropine, trihexyphenidyl  certain medicines that treat or prevent blood clots like warfarin, enoxaparin, dalteparin, apixaban, dabigatran, and rivaroxaban  digoxin  general anesthetics like halothane, isoflurane,  methoxyflurane, propofol  hydrochlorothiazide  ipratropium  lithium  medicines that relax muscles for surgery  metformin  narcotic medicines for pain  NSAIDs, medicines for pain and inflammation, like ibuprofen or naproxen  phenothiazines like chlorpromazine, mesoridazine, prochlorperazine, thioridazine  pioglitazone This list may not describe all possible interactions. Give your health care provider a list of all the medicines, herbs, non-prescription drugs, or dietary supplements you use. Also tell them if you smoke, drink alcohol, or use illegal drugs. Some items may interact with your medicine. What should I watch for while using this medicine? Visit your doctor or health care professional for regular checks on your progress. Tell your health care professional if your symptoms do not start to get better or if they get worse. Do not stop taking except on your health care professional's advice. You may develop a severe reaction. Your health care professional will tell you how much medicine to take. Wear a medical ID bracelet or chain. Carry a card that describes your disease and details of your medicine and dosage times. This medicine can reduce the response of your  body to heat or cold. Dress warm in cold weather and stay hydrated in hot weather. If possible, avoid extreme temperatures like saunas, hot tubs, very hot or cold showers, or activities that can cause dehydration such as vigorous exercise. Check with your health care professional if you have severe diarrhea, nausea, and vomiting, or if you sweat a lot. The loss of too much body fluid may make it dangerous for you to take this medicine. You may get drowsy or dizzy. Do not drive, use machinery, or do anything that needs mental alertness until you know how this medicine affects you. Do not stand up or sit up quickly, especially if you are an older patient. This reduces the risk of dizzy or fainting spells. Alcohol may interfere  with the effect of this medicine. Avoid alcoholic drinks. Tell your health care professional right away if you have any change in your eyesight. Patients and their families should watch out for new or worsening depression or thoughts of suicide. Also watch out for sudden changes in feelings such as feeling anxious, agitated, panicky, irritable, hostile, aggressive, impulsive, severely restless, overly excited and hyperactive, or not being able to sleep. If this happens, especially at the beginning of treatment or after a change in dose, call your healthcare professional. This medicine may cause serious skin reactions. They can happen weeks to months after starting the medicine. Contact your health care provider right away if you notice fevers or flu-like symptoms with a rash. The rash may be red or purple and then turn into blisters or peeling of the skin. Or, you might notice a red rash with swelling of the face, lips or lymph nodes in your neck or under your arms. Birth control may not work properly while you are taking this medicine. Talk to your health care professional about using an extra method of birth control. Women should inform their health care professional if they wish to become pregnant or think they might be pregnant. There is a potential for serious side effects and harm to an unborn child. Talk to your health care professional for more information. What side effects may I notice from receiving this medicine? Side effects that you should report to your doctor or health care professional as soon as possible:  allergic reactions like skin rash, itching or hives, swelling of the face, lips, or tongue  blood in the urine  changes in vision  confusion  loss of memory  pain in lower back or side  pain when urinating  redness, blistering, peeling or loosening of the skin, including inside the mouth  signs and symptoms of bleeding such as bloody or black, tarry stools; red or dark  brown urine; spitting up blood or brown material that looks like coffee grounds; red spots on the skin; unusual bruising or bleeding from the eyes, gums, or nose  signs and symptoms of increased acid in the body like breathing fast; fast heartbeat; headache; confusion; unusually weak or tired; nausea, vomiting  suicidal thoughts, mood changes  trouble speaking or understanding  unusual sweating  unusually weak or tired Side effects that usually do not require medical attention (report to your doctor or health care professional if they continue or are bothersome):  dizziness  drowsiness  fever  loss of appetite  nausea, vomiting  pain, tingling, numbness in the hands or feet  stomach pain  tiredness  upset stomach This list may not describe all possible side effects. Call your doctor for medical advice about side  effects. You may report side effects to FDA at 1-800-FDA-1088. Where should I keep my medicine? Keep out of the reach of children and pets. Store between 15 and 30 degrees C (59 and 86 degrees F). Protect from moisture. Keep the container tightly closed. Get rid of any unused medicine after the expiration date. To get rid of medicines that are no longer needed or have expired:  Take the medicine to a medicine take-back program. Check with your pharmacy or law enforcement to find a location.  If you cannot return the medicine, check the label or package insert to see if the medicine should be thrown out in the garbage or flushed down the toilet. If you are not sure, ask your health care provider. If it is safe to put it in the trash, empty the medicine out of the container. Mix the medicine with cat litter, dirt, coffee grounds, or other unwanted substance. Seal the mixture in a bag or container. Put it in the trash. NOTE: This sheet is a summary. It may not cover all possible information. If you have questions about this medicine, talk to your doctor, pharmacist, or  health care provider.  2021 Elsevier/Gold Standard (2020-01-26 15:41:57)

## 2020-10-11 NOTE — Telephone Encounter (Signed)
[facility]) called, need clarification on direction for  topiramate (TOPAMAX) 25 MG tablet.  Contact info: [phone]
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
## 2020-10-11 NOTE — Telephone Encounter (Signed)
Called the pharmacy back and confirmed instructions for the topiramate.

## 2020-12-25 ENCOUNTER — Ambulatory Visit: Payer: Medicaid Other | Admitting: Neurology

## 2020-12-25 ENCOUNTER — Encounter: Payer: Self-pay | Admitting: Neurology

## 2020-12-25 ENCOUNTER — Telehealth: Payer: Self-pay | Admitting: Neurology

## 2020-12-25 ENCOUNTER — Other Ambulatory Visit: Payer: Self-pay

## 2020-12-25 VITALS — BP 119/77 | HR 74 | Ht 65.0 in | Wt 186.0 lb

## 2020-12-25 DIAGNOSIS — S0003XA Contusion of scalp, initial encounter: Secondary | ICD-10-CM | POA: Diagnosis not present

## 2020-12-25 DIAGNOSIS — G43501 Persistent migraine aura without cerebral infarction, not intractable, with status migrainosus: Secondary | ICD-10-CM | POA: Diagnosis not present

## 2020-12-25 DIAGNOSIS — S0101XA Laceration without foreign body of scalp, initial encounter: Secondary | ICD-10-CM | POA: Diagnosis not present

## 2020-12-25 DIAGNOSIS — G44329 Chronic post-traumatic headache, not intractable: Secondary | ICD-10-CM | POA: Diagnosis not present

## 2020-12-25 MED ORDER — TOPIRAMATE 25 MG PO TABS: ORAL_TABLET | ORAL | 3 refills | Status: DC

## 2020-12-25 MED ORDER — SUMATRIPTAN SUCCINATE 100 MG PO TABS: ORAL_TABLET | ORAL | 2 refills | Status: DC

## 2020-12-25 NOTE — Patient Instructions (Signed)
Head Injury, Adult There are many types of head injuries. They can be as minor as a small bump. Some head injuries can be worse. Worse injuries include:  A strong hit to the head that shakes the brain back and forth, causing damage (concussion).  A bruise (contusion) of the brain. This means there is bleeding in the brain that can cause swelling.  A cracked skull (skull fracture).  Bleeding in the brain that gathers, gets thick (makes a clot), and forms a bump (hematoma). Most problems from a head injury come in the first 24 hours. However, you may still have side effects up to 7-10 days after your injury. It is important to watch your condition for any changes. You may need to be watched in the emergency department or urgent care, or you may need to stay in the hospital. What are the causes? There are many possible causes of a head injury. A serious head injury may be caused by:  A car accident.  Bicycle or motorcycle accidents.  Sports injuries.  Falls.  Being hit by an object. What are the signs or symptoms? Symptoms of a head injury include a bruise, bump, or bleeding where the injury happened. Other physical symptoms may include:  Headache.  Feeling like you may vomit (nauseous) or vomiting.  Dizziness.  Blurred or double vision.  Being uncomfortable around bright lights or loud noises.  Shaking movements that you cannot control (seizures).  Feeling tired.  Trouble being woken up.  Fainting or loss of consciousness. Mental or emotional symptoms may include:  Feeling grumpy or cranky.  Confusion and memory problems.  Having trouble paying attention or concentrating.  Changes in eating or sleeping habits.  Feeling worried or nervous (anxious).  Feeling sad (depressed). How is this treated? Treatment for this condition depends on how severe the injury is and the type of injury you have. The main goal is to prevent problems and to allow the brain time to  heal. Mild head injury If you have a mild head injury, you may be sent home, and treatment may include:  Being watched. A responsible adult should stay with you for 24 hours after your injury and check on you often.  Physical rest.  Brain rest.  Pain medicines. Severe head injury If you have a severe head injury, treatment may include:  Being watched closely. This includes staying in the hospital.  Medicines to: ? Help with pain. ? Prevent seizures. ? Help with brain swelling.  Protecting your airway and using a machine that helps you breathe (ventilator).  Treatments to watch for and manage swelling inside the brain.  Brain surgery. This may be needed to: ? Remove a collection of blood or blood clots. ? Stop the bleeding. ? Remove a part of the skull. This allows room for the brain to swell. Follow these instructions at home: Activity  Rest.  Avoid activities that are hard or tiring.  Make sure you get enough sleep.  Let your brain rest. Do this by limiting activities that need a lot of thought or attention, such as: ? Watching TV. ? Playing memory games and puzzles. ? Job-related work or homework. ? Working on the computer, social media, and texting.  Avoid activities that could cause another head injury until your doctor says it is okay. This includes playing sports. Having another head injury, especially before the first one has healed, can be dangerous.  Ask your doctor when it is safe for you to go back to   your normal activities, such as work or school. Ask your doctor for a step-by-step plan for slowly going back to your normal activities.  Ask your doctor when you can drive, ride a bicycle, or use heavy machinery. Do not do these activities if you are dizzy. Lifestyle  Do not drink alcohol until your doctor says it is okay.  Do not use drugs.  If it is harder than usual to remember things, write them down.  If you are easily distracted, try to do one  thing at a time.  Talk with family members or close friends when making important decisions.  Tell your friends, family, a trusted co-worker, and work manager about your injury, symptoms, and limits (restrictions). Have them watch for any problems that are new or getting worse.   General instructions  Take over-the-counter and prescription medicines only as told by your doctor.  Have someone stay with you for 24 hours after your head injury. This person should watch you for any changes in your symptoms and be ready to get help.  Keep all follow-up visits as told by your doctor. This is important. How is this prevented?  Work on your balance and strength. This can help you avoid falls.  Wear a seat belt when you are in a moving vehicle.  Wear a helmet when you: ? Ride a bicycle. ? Ski. ? Do any other sport or activity that has a risk of injury.  If you drink alcohol: ? Limit how much you use to:  0-1 drink a day for nonpregnant women.  0-2 drinks a day for men. ? Be aware of how much alcohol is in your drink. In the [place] one drink equals one 12 oz bottle of beer (355 mL), one 5 oz glass of wine (148 mL), or one 1 oz glass of hard liquor (44 mL).  Make your home safer by: ? Getting rid of clutter from the floors and stairs. This includes things that can make you trip. ? Using grab bars in bathrooms and handrails by stairs. ? Placing non-slip mats on floors and in bathtubs. ? Putting more light in dim areas. Where to find more information  Centers for Disease Control and Prevention: www.cdc.gov Get help right away if:  You have: ? A very bad headache that is not helped by medicine. ? Trouble walking or weakness in your arms and legs. ? Clear or bloody fluid coming from your nose or ears. ? Changes in how you see (vision). ? A seizure. ? More confusion or more grumpy moods.  Your symptoms get worse.  You are sleepier than normal and have trouble staying awake.  You  lose your balance.  The black centers of your eyes (pupils) change in size.  Your speech is slurred.  Your dizziness gets worse.  You vomit. These symptoms may be an emergency. Do not wait to see if the symptoms will go away. Get medical help right away. Call your local emergency services (911 in the [place] Do not drive yourself to the hospital. Summary  Head injuries can be as minor as a small bump. Some head injuries can be worse.  Treatment for this condition depends on how severe the injury is and the type of injury you have.  Have someone stay with you for 24 hours after your head injury.  Ask your doctor when it is safe for you to go back to your normal activities, such as work or school.  To prevent a head injury,   wear a seat belt in a car, wear a helmet when you use a bicycle, limit your alcohol use, and make your home safer. This information is not intended to replace advice given to you by your health care provider. Make sure you discuss any questions you have with your health care provider. Document Revised: 05/27/2019 Document Reviewed: 05/27/2019 Elsevier Patient Education  2021 [employer]vier Inc.  

## 2020-12-25 NOTE — Telephone Encounter (Signed)
Called the patient, there was no answer. LVM advising that Dr Vickey Huger has an opening today at 1:30 pm with check in of 1 pm. Instructed the patient to call back and we can further assess at apt.  If she can't come today, please encourage the patient to see her PCP or urgent care/ER as most likely she will need a head scan completed.
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
## 2021-01-09 IMAGING — DX DG CHEST 1V PORT
1 series · 1 of 1 positions shown · non-contrast
Comparison: 05/18/2017

CLINICAL DATA: Shortness of breath, 2I1KR-GS exposure

EXAM:
PORTABLE CHEST 1 VIEW

[chest ap]
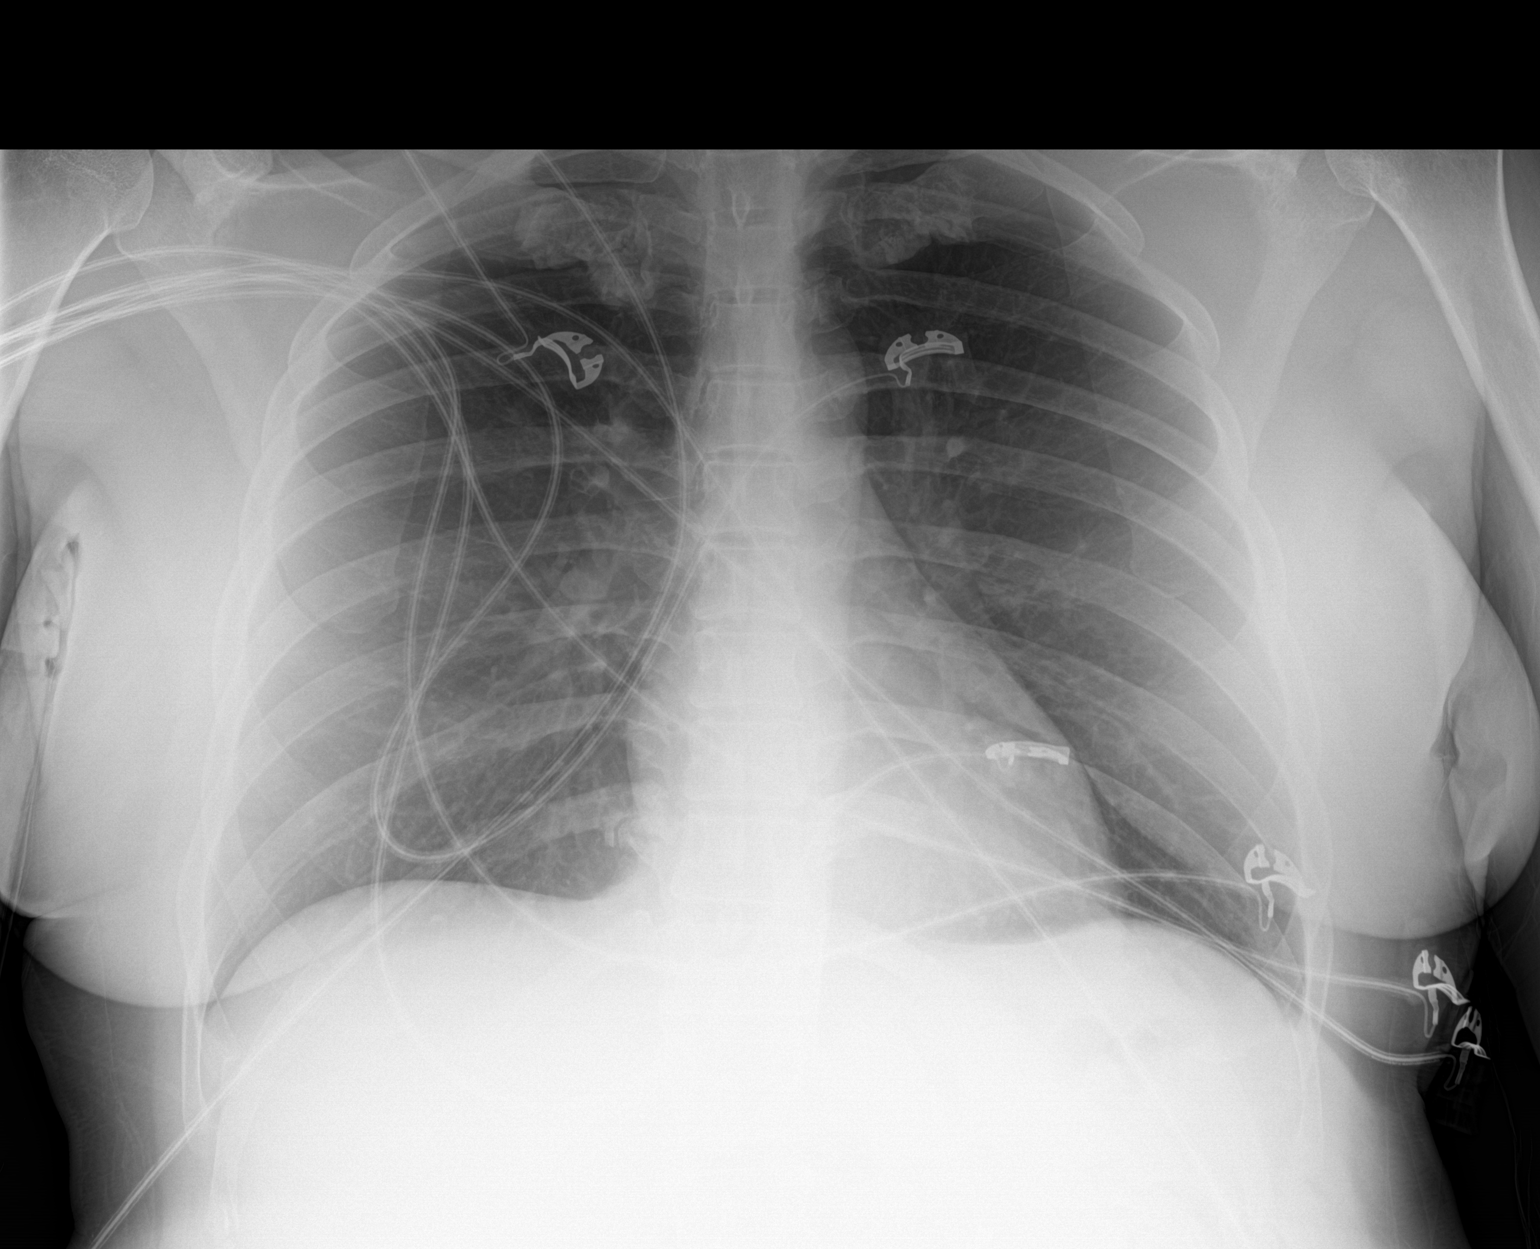

[1 of 1 positions shown; findings below may reference images not displayed]

FINDINGS: The heart size and mediastinal contours are within normal limits.
Both lungs are clear. The visualized skeletal structures are
unremarkable.
IMPRESSION: No active disease.

## 2021-01-15 ENCOUNTER — Ambulatory Visit
Admission: RE | Admit: 2021-01-15 | Discharge: 2021-01-15 | Disposition: A | Discharge status: Home / Self Care | Payer: Medicaid Other | Source: Ambulatory Visit | Attending: Neurology | Admitting: Neurology

## 2021-01-15 DIAGNOSIS — S0003XA Contusion of scalp, initial encounter: Secondary | ICD-10-CM | POA: Diagnosis not present

## 2021-01-15 DIAGNOSIS — G44329 Chronic post-traumatic headache, not intractable: Secondary | ICD-10-CM

## 2021-01-15 DIAGNOSIS — S0101XA Laceration without foreign body of scalp, initial encounter: Secondary | ICD-10-CM

## 2021-01-15 DIAGNOSIS — G43501 Persistent migraine aura without cerebral infarction, not intractable, with status migrainosus: Secondary | ICD-10-CM

## 2021-01-21 ENCOUNTER — Telehealth: Payer: Self-pay | Admitting: *Deleted

## 2021-01-21 NOTE — Progress Notes (Signed)
See phone note, pt aware of results.

## 2021-01-21 NOTE — Telephone Encounter (Signed)
Noted, thank you

## 2021-01-21 NOTE — Telephone Encounter (Signed)
Tried calling pt. Mailbox full, unable to LVM. Please let her know CT head normal is she call back.

## 2021-01-21 NOTE — Telephone Encounter (Signed)
FYI pt called back and the message was relayed to her, no call back requested.

## 2021-01-21 NOTE — Telephone Encounter (Signed)
-----   Message from Asa Lente, MD sent at 01/21/2021  1:00 PM EDT ----- Please let her know that the CT scan of the head was normal.
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
## 2021-04-18 ENCOUNTER — Ambulatory Visit: Payer: Medicaid Other | Admitting: Family Medicine

## 2021-07-18 ENCOUNTER — Ambulatory Visit: Payer: Medicaid Other | Admitting: Neurology
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
## 2021-10-21 ENCOUNTER — Encounter: Payer: Self-pay | Admitting: Neurology

## 2021-10-21 ENCOUNTER — Ambulatory Visit: Payer: Medicaid Other | Admitting: Neurology

## 2022-01-27 ENCOUNTER — Other Ambulatory Visit: Payer: Self-pay | Admitting: Neurology

## 2022-02-12 ENCOUNTER — Encounter: Payer: Self-pay | Admitting: Neurology

## 2022-02-12 ENCOUNTER — Ambulatory Visit (INDEPENDENT_AMBULATORY_CARE_PROVIDER_SITE_OTHER): Payer: Medicaid Other | Admitting: Neurology

## 2022-02-12 DIAGNOSIS — K146 Glossodynia: Secondary | ICD-10-CM

## 2022-02-12 MED ORDER — VITAMIN D 125 MCG (5000 UT) PO CAPS: 5000.0000 [IU] | ORAL_CAPSULE | Freq: Every day | ORAL | 5 refills | Status: AC

## 2022-02-12 MED ORDER — GOODSENSE PRENATAL VITAMINS 28-0.8 MG PO TABS: 1.0000 | ORAL_TABLET | Freq: Every day | ORAL | 5 refills | Status: AC
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
## 2022-02-12 NOTE — Patient Instructions (Signed)
Abnormal tongue sensation can develop after viral illness, can be related to medication ( topiramate) and can be related to deficiency in  vit D, in B12 and IRON deficiency . I recommend a 5000 IU over the counter Vit D supplement and a prenatal vitamin to take one daily each day with breakfast.   You can stop the topiramate and see if your migraines are controlled without it- if the headaches return , start again.   CC Dr Shawnie Pons.

## 2022-03-16 IMAGING — DX DG CHEST 2V
2 series · 2 of 2 positions shown · non-contrast
Comparison: One-view chest x-ray 06/29/2019

CLINICAL DATA: Left-sided chest pain

EXAM:
CHEST - 2 VIEW

[chest pa]
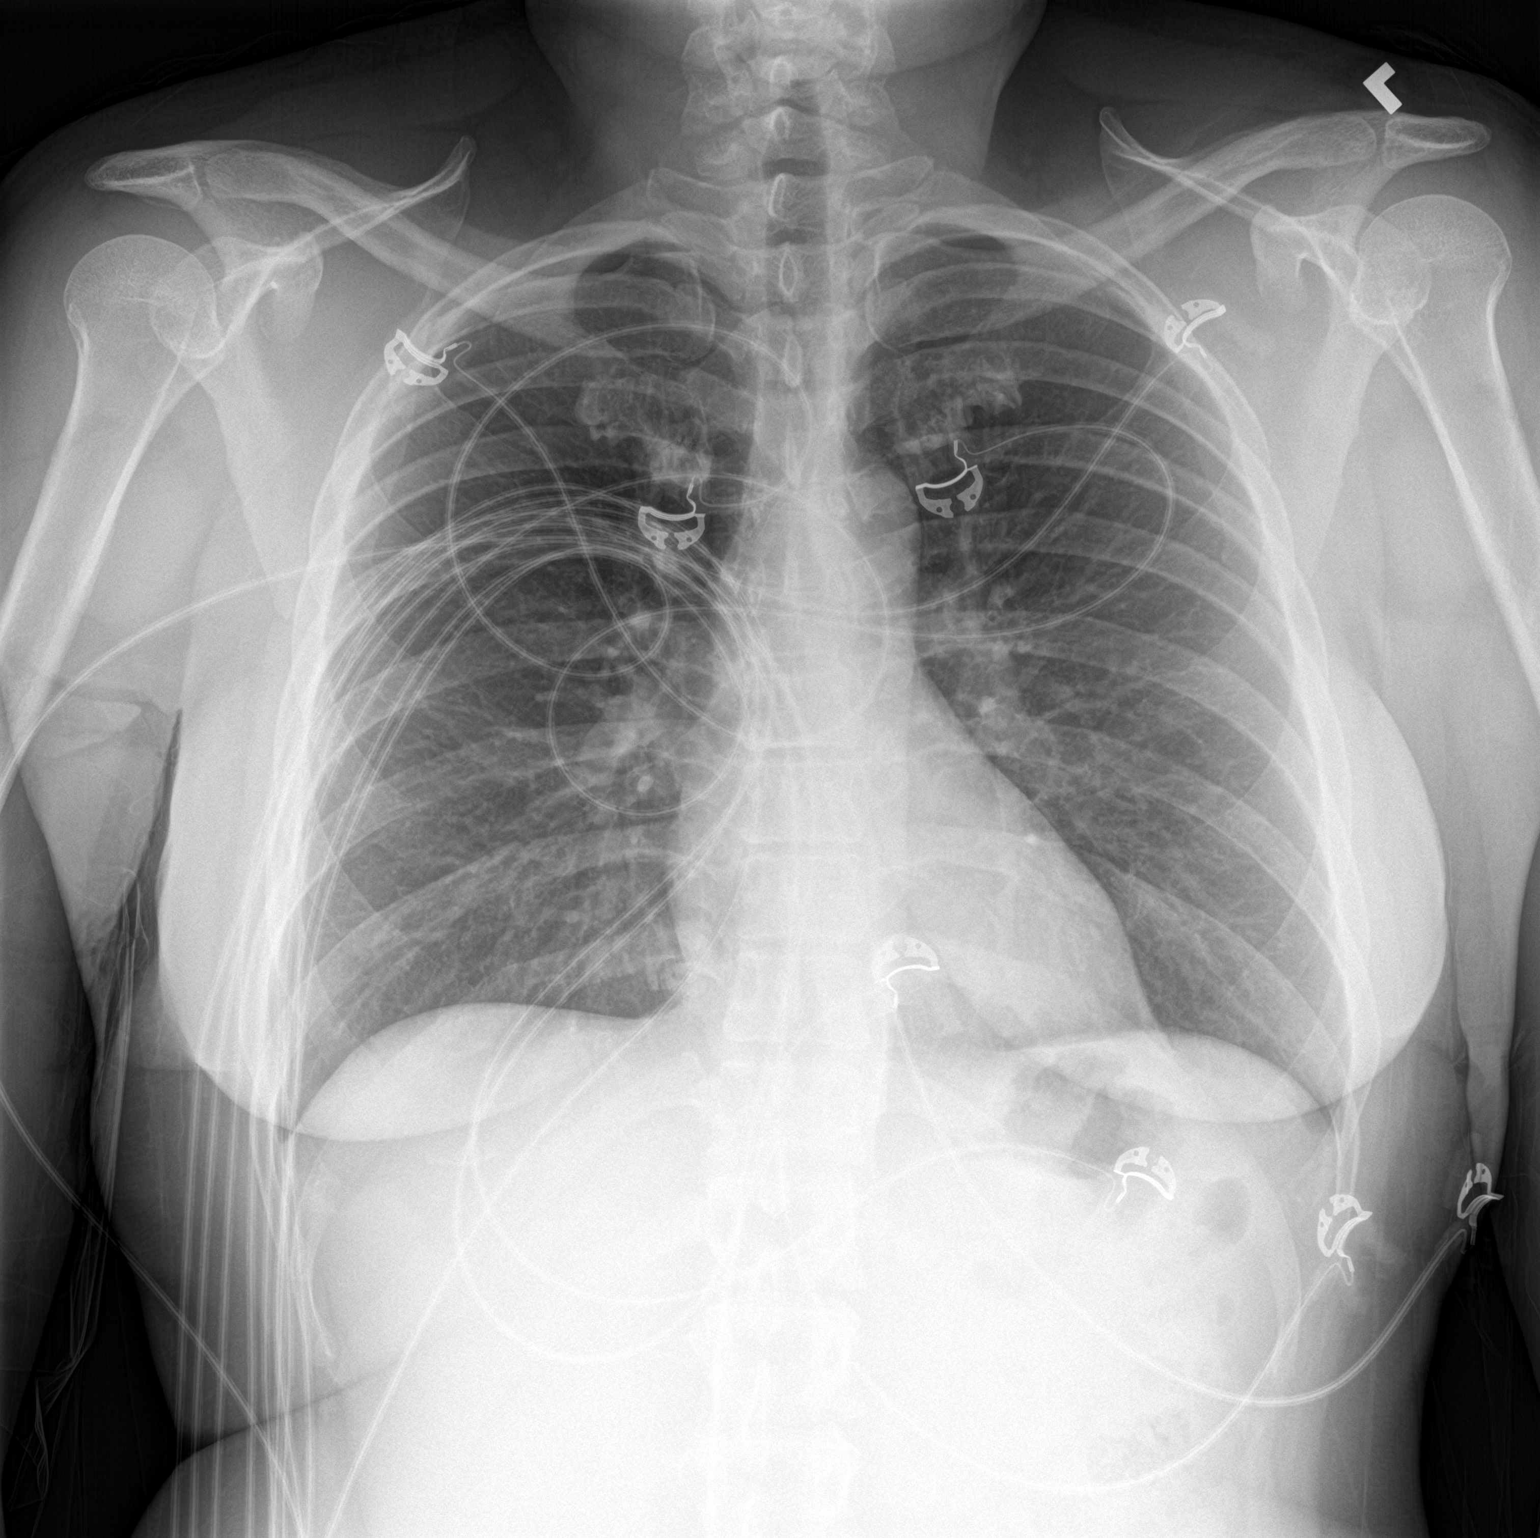

[chest lat]
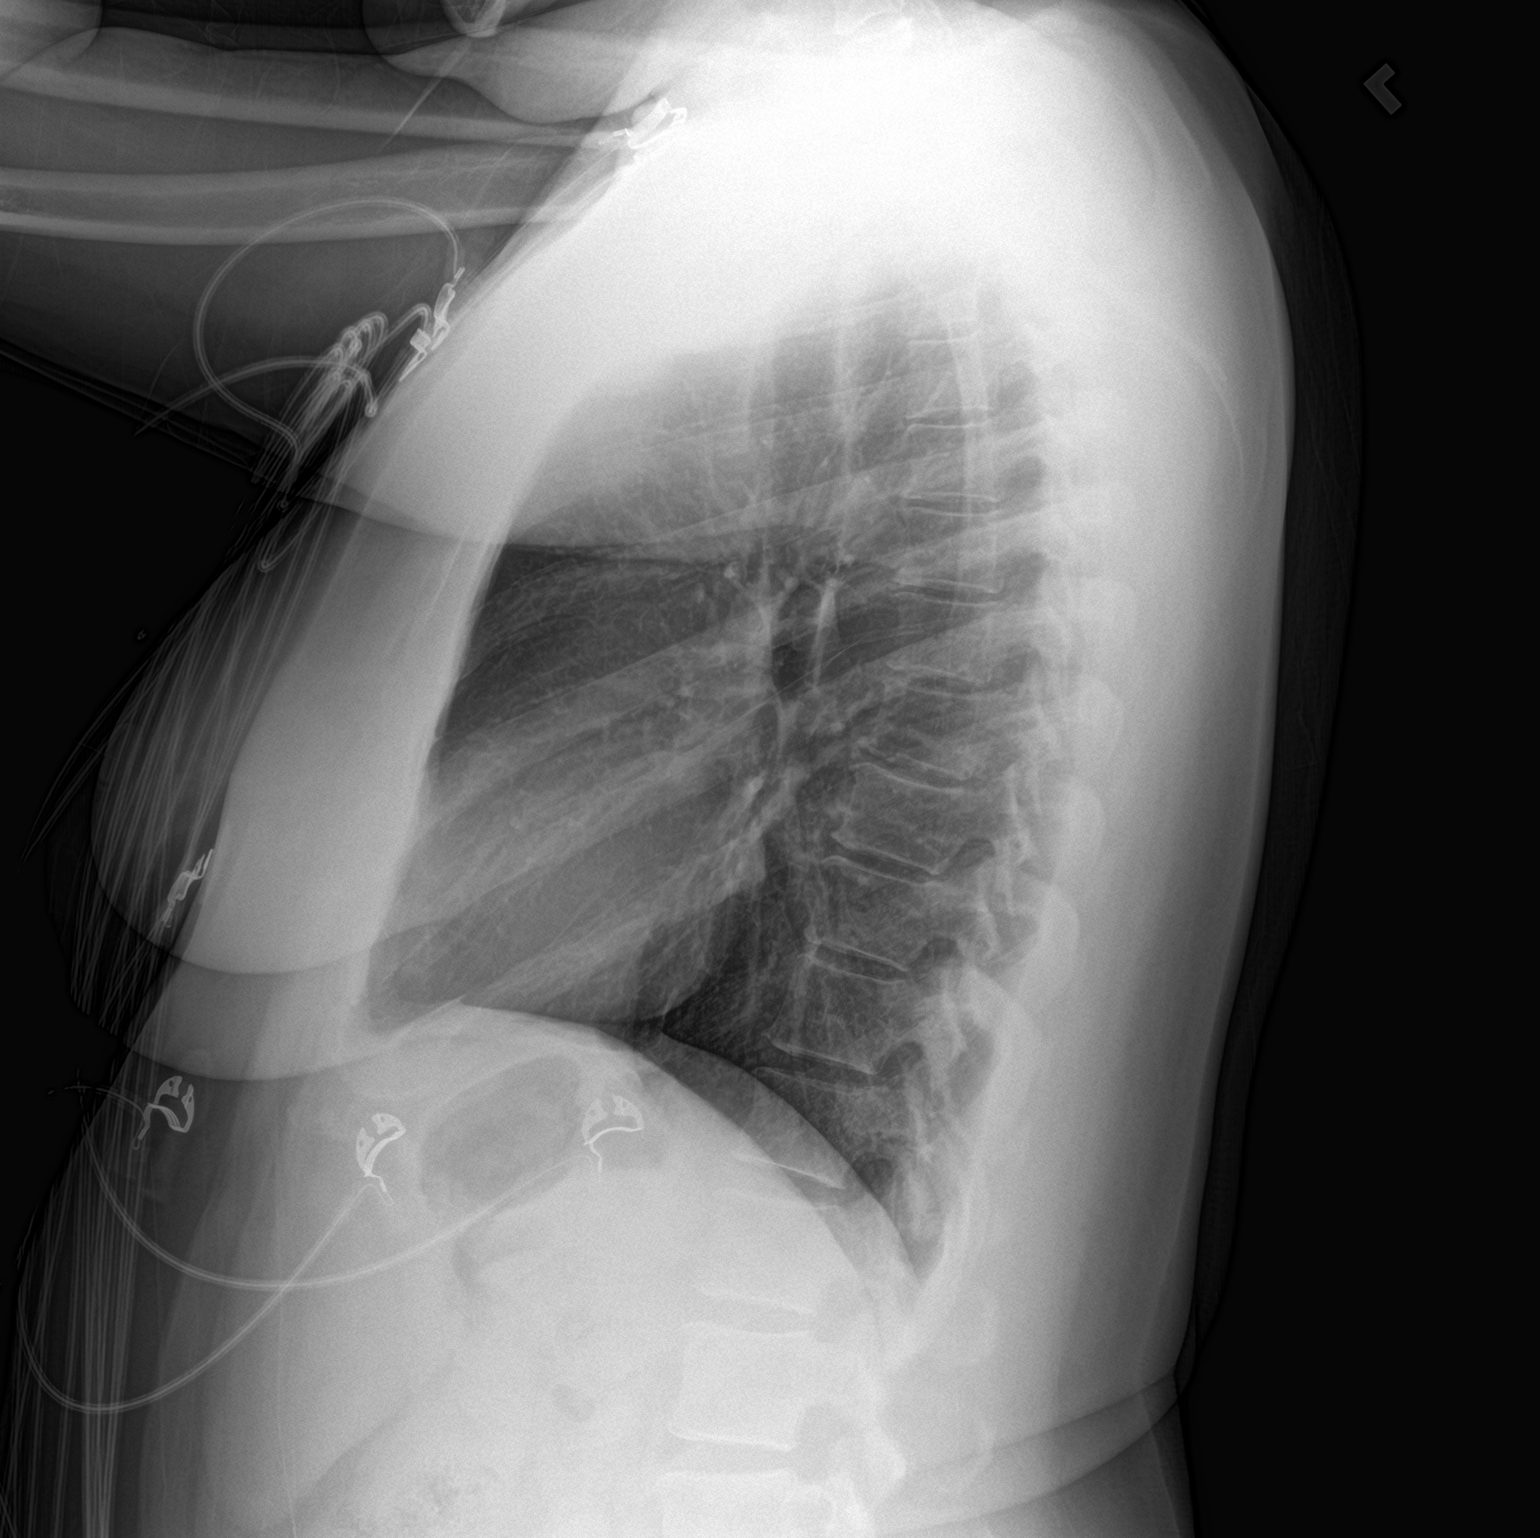

[2 of 2 positions shown; findings below may reference images not displayed]

FINDINGS: Heart size is normal. No edema or effusion is present. No focal
airspace disease is evident. The visualized soft tissues and bony
thorax are unremarkable.
IMPRESSION: Negative two view chest x-ray.

## 2022-05-07 ENCOUNTER — Other Ambulatory Visit: Payer: Self-pay | Admitting: Neurology

## 2022-06-11 ENCOUNTER — Other Ambulatory Visit: Payer: Self-pay | Admitting: Neurology

## 2022-09-11 ENCOUNTER — Ambulatory Visit: Payer: Medicaid Other | Admitting: Neurology

## 2022-09-11 ENCOUNTER — Encounter: Payer: Self-pay | Admitting: Neurology

## 2022-09-11 VITALS — BP 122/72 | HR 80 | Ht 65.0 in | Wt 175.8 lb

## 2022-09-11 DIAGNOSIS — S069X1S Unspecified intracranial injury with loss of consciousness of 30 minutes or less, sequela: Secondary | ICD-10-CM

## 2022-09-11 DIAGNOSIS — S069XAA Unspecified intracranial injury with loss of consciousness status unknown, initial encounter: Secondary | ICD-10-CM | POA: Insufficient documentation

## 2022-09-11 DIAGNOSIS — R519 Headache, unspecified: Secondary | ICD-10-CM | POA: Diagnosis not present

## 2022-09-11 DIAGNOSIS — F0781 Postconcussional syndrome: Secondary | ICD-10-CM | POA: Diagnosis not present
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
## 2022-09-11 NOTE — Patient Instructions (Signed)
Post-Concussion Syndrome  A concussion is a brain injury from a direct hit to the head or body. This hit causes the brain to shake back and forth fast inside the skull. The shaking can damage brain cells and cause chemical changes in the brain. Concussions are normally not life-threatening but can cause serious symptoms. Post-concussion syndrome is when symptoms that happen after a concussion last longer than normal. These symptoms can last from weeks to months. What are the causes? The cause of this condition is not known. It can happen whether your head injury was mild or severe. What increases the risk? You are more likely to get this condition if: You are female. You are a child, teen, or young adult. You have had a head injury before. You have a history of headaches. You have depression or anxiety. You have more than one symptom or severe symptoms when your concussion occurs. You faint or cannot remember the event (have amnesia of the event). What are the signs or symptoms? Symptoms of this condition include: Physical symptoms, such as: Headaches. Tiredness. Dizziness and weakness. Blurred vision and sensitivity to light. Trouble hearing. Problems with balance. Mental and emotional symptoms, such as: Memory problems and trouble focusing. Trouble falling asleep or staying asleep (insomnia). Feeling irritable. Anxiety or depression. Trouble learning new things. How is this diagnosed? This condition may be diagnosed based on: Your symptoms. A description of your injury. Your medical history. Testing your strength, balance, and nerve function (neurological exam). Your health care provider may order other tests. These may include: Brain imaging, such as a CT scan or an MRI. Memory testing (neuropsychological testing). How is this treated? Treatment for this condition may depend on your symptoms. Symptoms normally go away on their own with time. If you need treatment, it may  include: Medicines for headaches, anxiety, depression, and insomnia. Resting your brain and body for a few days after your injury. Rehab therapy, such as: Physical or occupational therapy. This may include exercises to help with balance and dizziness. Mental health counseling. A form of talk therapy called cognitive behavioral therapy (CBT) can be especially helpful. This therapy helps you set goals and follow up on the changes that you make. Speech therapy. Vision therapy. A brain and eye specialist can recommend treatments for vision problems. Follow these instructions at home: Medicines Take over-the-counter and prescription medicines only as told by your health care provider. Avoid opioid prescription pain medicines when getting over a concussion. Activity Limit your mental activities for the first few days after your injury. This may include not doing these things: Homework or work for your job. Complex thinking. Watching TV. Using a computer or phone. Playing memory games and puzzles. Slowly return to your normal activity level. If a certain activity brings on your symptoms, stop or slow down until you can do the activity without getting symptoms again. Limit physical activity, such as sports or strenuous activities, for the first few days after a concussion. Slowly return to normal activity as told by your health care provider. Light exercise may be helpful. Rest helps your brain heal. Make sure you: Get plenty of sleep at night. Most adults should get at least 7-9 hours of sleep each night. Rest during the day. Take naps or rest breaks when you feel tired. Do not do high-risk activities that could cause a second concussion, such as riding a bike or playing sports. Having another concussion before the first one has healed can be harmful. General instructions  Do  not drink alcohol until your health care provider says that you can. Keep track of how severe your symptoms are and how  often they happen. Give this information to your health care provider. Keep all follow-up visits. Your health care provider may need to check you for new or serious symptoms. Where to find more information [place]: concussionfoundation.org Contact a health care provider if: Your symptoms do not improve. You get injured again. You have unusual behavior changes. Get help right away if: You have a severe or worsening headache. You have weakness or numbness in any part of your body. You vomit repeatedly. You have mental status changes, such as: Confusion. Trouble speaking. Trouble staying awake. Fainting. You have a seizure. These symptoms may be an emergency. Get help right away. Call 911. Do not wait to see if the symptoms will go away. Do not drive yourself to the hospital. Also, get help right away if: You think about hurting yourself or others. Take one of these steps if you feel like you may hurt yourself or others, or have thoughts about taking your own life: Go to your nearest emergency room. Call 911. Call the [facility] at [phone] or 988. This is open 24 hours a day. Text the Crisis Text Line at (713)301-4583. This information is not intended to replace advice given to you by your health care provider. Make sure you discuss any questions you have with your health care provider. Document Revised: 12/06/2021 Document Reviewed: 12/06/2021 Elsevier Patient Education  [place].
